# Patient Record
Sex: Female | Born: 1990 | Race: Black or African American | Hispanic: No | Smoking: Current every day smoker
Health system: Southern US, Community
[De-identification: ages and names within clinical notes are randomized; demographics above are authoritative.]

## PROBLEM LIST (undated history)

## (undated) ENCOUNTER — Inpatient Hospital Stay: Payer: Self-pay

## (undated) ENCOUNTER — Inpatient Hospital Stay: Admission: RE | Payer: Self-pay | Source: Home / Self Care

## (undated) DIAGNOSIS — N83209 Unspecified ovarian cyst, unspecified side: Secondary | ICD-10-CM

## (undated) DIAGNOSIS — R011 Cardiac murmur, unspecified: Secondary | ICD-10-CM

## (undated) HISTORY — PX: WISDOM TOOTH EXTRACTION: SHX21

---
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
## 2011-02-09 ENCOUNTER — Emergency Department (HOSPITAL_COMMUNITY)
Admission: EM | Admit: 2011-02-09 | Discharge: 2011-02-09 | Disposition: A | Discharge status: Home / Self Care | Payer: Self-pay | Attending: Emergency Medicine | Admitting: Emergency Medicine

## 2011-02-09 ENCOUNTER — Encounter (HOSPITAL_COMMUNITY): Payer: Self-pay

## 2011-02-09 DIAGNOSIS — K089 Disorder of teeth and supporting structures, unspecified: Secondary | ICD-10-CM | POA: Insufficient documentation

## 2011-02-09 DIAGNOSIS — K0889 Other specified disorders of teeth and supporting structures: Secondary | ICD-10-CM

## 2011-02-09 DIAGNOSIS — K029 Dental caries, unspecified: Secondary | ICD-10-CM | POA: Insufficient documentation

## 2011-02-09 LAB — POCT PREGNANCY, URINE: Preg Test, Ur: NEGATIVE

## 2011-02-09 MED ORDER — PENICILLIN V POTASSIUM 500 MG PO TABS: 500.0000 mg | ORAL_TABLET | Freq: Four times a day (QID) | ORAL | Status: AC

## 2011-02-09 MED ORDER — IBUPROFEN 800 MG PO TABS
800.0000 mg | ORAL_TABLET | Freq: Once | ORAL | Status: AC
  Administered 2011-02-09: 800 mg via ORAL
  Filled 2011-02-09: qty 1

## 2011-02-09 MED ORDER — HYDROCODONE-ACETAMINOPHEN 5-325 MG PO TABS
1.0000 | ORAL_TABLET | Freq: Once | ORAL | Status: AC
  Administered 2011-02-09: 1 via ORAL
  Filled 2011-02-09: qty 1

## 2011-02-09 MED ORDER — HYDROCODONE-ACETAMINOPHEN 5-325 MG PO TABS: ORAL_TABLET | ORAL | Status: DC

## 2011-02-09 MED ORDER — PENICILLIN V POTASSIUM 250 MG PO TABS
500.0000 mg | ORAL_TABLET | Freq: Once | ORAL | Status: AC
  Administered 2011-02-09: 500 mg via ORAL
  Filled 2011-02-09: qty 2

## 2011-02-09 NOTE — ED Provider Notes (Signed)
Medical screening examination/treatment/procedure(s) were performed by non-physician practitioner and as supervising physician I was immediately available for consultation/collaboration. Roston Grunewald, MD, FACEP   Rayni Nemitz L Rayford Williamsen, MD 02/09/11 1949 

## 2011-02-09 NOTE — ED Provider Notes (Signed)
History     CSN: [id number]  Arrival date & time 02/09/11  1020   None     Chief Complaint  Patient presents with  . Dental Pain    (Consider location/radiation/quality/duration/timing/severity/associated sxs/prior treatment) Patient is a 21 y.o. female presenting with tooth pain. The history is provided by the patient. No language interpreter was used.  Dental PainThe primary symptoms include mouth pain. Primary symptoms do not include dental injury or fever. Episode onset: last PM. The symptoms are unchanged. The symptoms occur constantly.  Additional symptoms include: jaw pain. Additional symptoms do not include: facial swelling.    History reviewed. No pertinent past medical history.  History reviewed. No pertinent past surgical history.  No family history on file.  History  Substance Use Topics  . Smoking status: Never Smoker   . Smokeless tobacco: Not on file  . Alcohol Use: No    OB History    Grav Para Term Preterm Abortions TAB SAB Ect Mult Living                  Review of Systems  Constitutional: Negative for fever.  HENT: Positive for dental problem. Negative for facial swelling.   All other systems reviewed and are negative.    Allergies  Review of patient's allergies indicates no known allergies.  Home Medications   Current Outpatient Rx  Name Route Sig Dispense Refill  . ACETAMINOPHEN 500 MG PO TABS Oral Take 1,000 mg by mouth every 6 (six) hours as needed. Tooth ache    . FLUOXETINE HCL 20 MG PO CAPS Oral Take 20 mg by mouth every morning.    Marland Kitchen LORAZEPAM 0.5 MG PO TABS Oral Take 0.5 mg by mouth every 8 (eight) hours as needed. anxiety    . TRAZODONE HCL 50 MG PO TABS Oral Take 50 mg by mouth at bedtime.      BP 150/91  Pulse 67  Temp(Src) 98.7 F (37.1 C) (Oral)  Resp 18  Ht 5\' 6"  (1.676 m)  Wt 225 lb (102.059 kg)  BMI 36.32 kg/m2  SpO2 100%  LMP 12/29/2010  Physical Exam  Nursing note and vitals reviewed. Constitutional: She is  oriented to person, place, and time. She appears well-developed and well-nourished. No distress.  HENT:  Head: Normocephalic and atraumatic.  Mouth/Throat: Uvula is midline, oropharynx is clear and moist and mucous membranes are normal. Dental caries present. No uvula swelling.    Eyes: EOM are normal.  Neck: Normal range of motion.  Cardiovascular: Normal rate, regular rhythm and normal heart sounds.   Pulmonary/Chest: Effort normal and breath sounds normal.  Abdominal: Soft. She exhibits no distension. There is no tenderness.  Musculoskeletal: Normal range of motion.  Neurological: She is alert and oriented to person, place, and time.  Skin: Skin is warm and dry.  Psychiatric: She has a normal mood and affect. Judgment normal.    ED Course  Procedures (including critical care time)   Labs Reviewed  POCT PREGNANCY, URINE   No results found.   No diagnosis found.    MDM          Worthy Rancher, PA 02/09/11 1228  Worthy Rancher, PA 02/09/11 [phone]

## 2011-02-09 NOTE — ED Notes (Signed)
Pt reports has had broken tooth for the past year but pain flared up today.  Also reports had positive pregnancy test in [place] or Nov but says has been having a period every month that lasts for 3 days.

## 2011-02-09 NOTE — ED Notes (Signed)
Reports she took a pregnancy test 3 months ago and it was positive. POC preg today is negative.

## 2011-04-26 ENCOUNTER — Emergency Department: Payer: Self-pay | Admitting: *Deleted

## 2011-04-26 LAB — URINALYSIS, COMPLETE
Bilirubin,UR: NEGATIVE
Glucose,UR: NEGATIVE mg/dL (ref 0–75)
RBC,UR: 1 /HPF (ref 0–5)
Specific Gravity: 1.023 (ref 1.003–1.030)
Squamous Epithelial: 3
WBC UR: 5 /HPF (ref 0–5)

## 2011-04-26 LAB — COMPREHENSIVE METABOLIC PANEL
Alkaline Phosphatase: 62 U/L (ref 50–136)
Anion Gap: 11 (ref 7–16)
BUN: 5 mg/dL — ABNORMAL LOW (ref 7–18)
Bilirubin,Total: 0.5 mg/dL (ref 0.2–1.0)
Osmolality: 272 (ref 275–301)
Total Protein: 7.5 g/dL (ref 6.4–8.2)

## 2011-04-26 LAB — CBC
HGB: 9.9 g/dL — ABNORMAL LOW (ref 12.0–16.0)
MCH: 19 pg — ABNORMAL LOW (ref 26.0–34.0)
MCHC: 30.4 g/dL — ABNORMAL LOW (ref 32.0–36.0)
MCV: 62 fL — ABNORMAL LOW (ref 80–100)

## 2011-04-26 LAB — HCG, QUANTITATIVE, PREGNANCY: Beta Hcg, Quant.: 88448 m[IU]/mL — ABNORMAL HIGH

## 2011-05-11 ENCOUNTER — Emergency Department: Payer: Self-pay

## 2011-05-11 LAB — CBC
MCH: 18.6 pg — ABNORMAL LOW (ref 26.0–34.0)
RBC: 5.09 10*6/uL (ref 3.80–5.20)
WBC: 7.1 10*3/uL (ref 3.6–11.0)

## 2011-05-11 LAB — COMPREHENSIVE METABOLIC PANEL
Albumin: 3.3 g/dL — ABNORMAL LOW (ref 3.4–5.0)
Creatinine: 0.58 mg/dL — ABNORMAL LOW (ref 0.60–1.30)
Glucose: 75 mg/dL (ref 65–99)
Potassium: 4.1 mmol/L (ref 3.5–5.1)
Total Protein: 7.1 g/dL (ref 6.4–8.2)

## 2011-05-11 LAB — URINALYSIS, COMPLETE
Ph: 6 (ref 4.5–8.0)
Protein: 30
RBC,UR: 6 /HPF (ref 0–5)

## 2011-05-11 LAB — HCG, QUANTITATIVE, PREGNANCY: Beta Hcg, Quant.: 133767 m[IU]/mL — ABNORMAL HIGH

## 2011-10-25 ENCOUNTER — Observation Stay: Payer: Self-pay | Admitting: Obstetrics and Gynecology

## 2011-12-04 ENCOUNTER — Observation Stay: Payer: Self-pay

## 2011-12-04 LAB — URINALYSIS, COMPLETE
Glucose,UR: NEGATIVE mg/dL (ref 0–75)
Leukocyte Esterase: NEGATIVE
Protein: NEGATIVE
RBC,UR: <1 /HPF (ref 0–5)
Specific Gravity: 1.009 (ref 1.003–1.030)
WBC UR: 2 /HPF (ref 0–5)

## 2011-12-05 ENCOUNTER — Observation Stay: Payer: Self-pay

## 2011-12-07 ENCOUNTER — Inpatient Hospital Stay: Payer: Self-pay | Admitting: Obstetrics and Gynecology

## 2011-12-07 ENCOUNTER — Observation Stay: Payer: Self-pay | Admitting: Obstetrics and Gynecology

## 2011-12-08 LAB — CBC WITH DIFFERENTIAL/PLATELET
Basophil #: 0.1 10*3/uL (ref 0.0–0.1)
Lymphocyte %: 21.8 %
Monocyte %: 8.2 %
Platelet: 163 10*3/uL (ref 150–440)
RDW: 17.3 % — ABNORMAL HIGH (ref 11.5–14.5)

## 2011-12-09 LAB — BETA STREP CULTURE(ARMC)

## 2011-12-09 LAB — HEMATOCRIT: HCT: 25.9 % — ABNORMAL LOW (ref 35.0–47.0)

## 2012-07-05 ENCOUNTER — Emergency Department: Payer: Self-pay | Admitting: Unknown Physician Specialty

## 2014-05-20 NOTE — H&P (Signed)
L&D Evaluation:  History:   HPI 24 yo G3P1011 [facility] uls presents with constant lower abdominal pressure/pain x 3 days. She also c/o mid upper abdominal stinging pain for the same amt of time sometimes accompanied by a tightening feeling. Patient currently living at [facility] and was brought in by someone who works there. C/O some LOF or increased vaginal discharge, but denies vaginal bleeding. Baby active. She has had increased frequency but no dysuria. No diarrhea, N/V. Denies trauma or fall.  [facility], MJ use and a known 5cm R ovarian cyst seen on 23wk scan. She has lost weight with the pregnancy. Hx of SVD at term in 2007.    Presents with abdominal pain, leaking fluid    Patient's Medical History Postpartum depression, Anemia    Patient's Surgical History none    Medications Pre Natal Vitamins    Allergies NKDA    Social History tobacco  drugs  MJ    Family History Anemia, HTN, HTN, CAD   ROS:   ROS see HPI   Exam:   Vital Signs stable  115/71. Patient in NAD, appears comfortable    Urine Protein negative dipstick, UA unremarkable    General no apparent distress    Mental Status clear    Abdomen gravid, non-tender, vtx on [place] and US, upper abdomen soft, ND, with normal BS, no guarding    Estimated Fetal Weight Average for gestational age    Fetal Position vtx    Pelvic no external lesions, 1/75%/-1. Wet prep negative. fern negative. Nitrazine neg    Mebranes Intact    FHT 125 baseline with accels to 150, mod variability. Some early decels with some contractions    FHT Description Reactive NST    Ucx irregular, occasional    Skin dry    Other AFI=3.04cm+, 2.45cm+2.61cm=8.14cm   Impression:   Impression IUP at 39 2/7 weeks not in labor and no ROM. Lower abdominal pressure probably due to baby moving into pelvis.   Plan:   Plan DC home with labor precautions. RTN to L&D on 11/29 for  NST/AFI if NIL.   Electronic Signatures: Trinna BalloonGutierrez, Rethel Sebek L (CNM)  [phone](Signed 26-Nov-13 00:22)  Authored: L&D Evaluation   Last Updated: 26-Nov-13 00:22 by Trinna BalloonGutierrez, Zarah Carbon L (CNM)

## 2014-05-20 NOTE — H&P (Signed)
L&D Evaluation:  History:   HPI 24 yo G3P1011 @ 33wks EDC 12/09/11 by 7wk uls presents with RLQ pain and vaginal spotting x 2 today.  Denies recent intercourse.  Her RLQ pain has been present for a week but worsened today.  She has had no other bleeding.  [facility], MJ use and a known 5cm R ovarian cyst seen on 23wk scan.    Presents with abdominal pain, vaginal bleeding    Patient's Medical History Postpartum depression    Patient's Surgical History none    Medications Pre Natal Vitamins    Allergies NKDA    Social History tobacco  drugs  MJ    Family History Anemia, HTN, HTN, CAD   ROS:   ROS All systems were reviewed.  HEENT, CNS, GI, GU, Respiratory, CV, Renal and Musculoskeletal systems were found to be normal., except per HPI   Exam:   Vital Signs stable    Urine Protein not completed    General no apparent distress    Mental Status clear    Chest clear    Heart normal sinus rhythm    Abdomen gravid, non-tender    Estimated Fetal Weight Average for gestational age    Fetal Position Vertex (confirmed by limited bedside scan to assess placentation since only 7wk scan available for review), placenta is posterior fundal    Edema no edema    Pelvic cervix closed and thick, no bleeding noted    Mebranes Intact    FHT normal rate with no decels, +Accels, reactive NST    Fetal Heart Rate 130    Ucx absent    Skin dry    Lymph no lymphadenopathy   Impression:   Impression RLQ pain, spotting   Plan:   Comments 1)  Reassurance re: occasional spotting in 3rd trimester.  No placenta previa noted.  Call or return to ER with increased bleeding or pain. 2)  RLQ pain likely from known 5cm cyst on RO.  Again, pt to return with worsening pain.  Cyst this size in 3rd trimester pregnancy less likely to torse.  Pt told to monitor symptoms.   3)  Keep scheduled f/u with [facility].   Electronic Signatures: Senaida LangeWeaver-Lee, Trinity Hyland (MD)  (Signed 15-Oct-13  19:04)  Authored: L&D Evaluation   Last Updated: 15-Oct-13 19:04 by Senaida LangeWeaver-Lee, Kenniel Bergsma (MD)
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
## 2014-05-20 NOTE — H&P (Signed)
L&D Evaluation:  History Expanded:   HPI 24 [facility] BF [facility]  presents with the question of ruptured membranes, she says she is having ctx every 5 minutes and was 1 cm in the office yesterday. she says she is leaking and has been since she was checked. good fetal mvmt GBS neg done on 11/04/11. pt has been pos for MJ, and chlamydia, she has anemia, and had bad PP depression with an [place] if 16, no meds at the time, got tDap on 9/25    Gravida 3    Term 1    PreTerm 0    Abortion 1    Living 1    Blood Type (Maternal) A positive    Group B Strep Results Maternal (Result >5wks must be treated as unknown) negative    Maternal HIV Negative    Maternal Syphilis Ab Nonreactive    Maternal Varicella Immune    Rubella Results (Maternal) immune    Maternal T-Dap Immune    [facility]    Presents with contractions, leaking fluid    Patient's Medical History No Chronic Illness    Patient's Surgical History none    Medications Pre Natal Vitamins    Allergies NKDA    Social History none    Family History Non-Contributory   ROS:   ROS All systems were reviewed.  HEENT, CNS, GI, GU, Respiratory, CV, Renal and Musculoskeletal systems were found to be normal.   Exam:   Vital Signs stable    Urine Protein not completed    General no apparent distress    Mental Status clear    Chest clear    Heart normal sinus rhythm    Abdomen gravid, tender with contractions    Estimated Fetal Weight Average for gestational age    Fetal Position v    Fundal Height term    Back no CVAT    Pelvic no external lesions, 2 cm    Mebranes Intact, although pt states leaking    FHT normal rate with no decels    FHT Description normal variability  no decels    Fetal Heart Rate 145    Ucx irregular    Skin dry    Lymph no lymphadenopathy   Impression:   Impression evidence of rupture will do testing to be sure   Plan:   Plan ph, fern and pooling  check    Comments GBS was done on 10/25.Marland Kitchen.Marland Kitchen.so will expire 5 wks, if not kept then we will submit a new test so it is on file.    Follow Up Appointment need to schedule   Electronic Signatures: Adria DevonKlett, Therman Hughlett (MD)  (Signed [phone]-Nov-13 10:30)  Authored: L&D Evaluation   Last Updated: 27-Nov-13 10:30 by Adria DevonKlett, Kahleb Mcclane (MD)

## 2014-12-15 ENCOUNTER — Emergency Department
Admission: EM | Admit: 2014-12-15 | Discharge: 2014-12-15 | Disposition: A | Discharge status: Home / Self Care | Payer: No Typology Code available for payment source | Attending: Emergency Medicine | Admitting: Emergency Medicine

## 2014-12-15 ENCOUNTER — Emergency Department: Payer: No Typology Code available for payment source

## 2014-12-15 ENCOUNTER — Encounter: Payer: Self-pay | Admitting: *Deleted

## 2014-12-15 DIAGNOSIS — Y92410 Unspecified street and highway as the place of occurrence of the external cause: Secondary | ICD-10-CM | POA: Insufficient documentation

## 2014-12-15 DIAGNOSIS — S29012A Strain of muscle and tendon of back wall of thorax, initial encounter: Secondary | ICD-10-CM | POA: Insufficient documentation

## 2014-12-15 DIAGNOSIS — Y9389 Activity, other specified: Secondary | ICD-10-CM | POA: Diagnosis not present

## 2014-12-15 DIAGNOSIS — S29019A Strain of muscle and tendon of unspecified wall of thorax, initial encounter: Secondary | ICD-10-CM

## 2014-12-15 DIAGNOSIS — S46911A Strain of unspecified muscle, fascia and tendon at shoulder and upper arm level, right arm, initial encounter: Secondary | ICD-10-CM | POA: Diagnosis not present

## 2014-12-15 DIAGNOSIS — S161XXA Strain of muscle, fascia and tendon at neck level, initial encounter: Secondary | ICD-10-CM | POA: Insufficient documentation

## 2014-12-15 DIAGNOSIS — F17200 Nicotine dependence, unspecified, uncomplicated: Secondary | ICD-10-CM | POA: Diagnosis not present

## 2014-12-15 DIAGNOSIS — Z79899 Other long term (current) drug therapy: Secondary | ICD-10-CM | POA: Insufficient documentation

## 2014-12-15 DIAGNOSIS — Y998 Other external cause status: Secondary | ICD-10-CM | POA: Insufficient documentation

## 2014-12-15 DIAGNOSIS — S199XXA Unspecified injury of neck, initial encounter: Secondary | ICD-10-CM | POA: Diagnosis present

## 2014-12-15 MED ORDER — IBUPROFEN 800 MG PO TABS: 800.0000 mg | ORAL_TABLET | Freq: Three times a day (TID) | ORAL | Status: DC

## 2014-12-15 NOTE — Discharge Instructions (Signed)
Cervical Sprain A cervical sprain is when the tissues (ligaments) that hold the neck bones in place stretch or tear. HOME CARE   Put ice on the injured area.  Put ice in a plastic bag.  Place a towel between your skin and the bag.  Leave the ice on for 15-20 minutes, 3-4 times a day.  You may have been given a collar to wear. This collar keeps your neck from moving while you heal.  Do not take the collar off unless told by your doctor.  If you have long hair, keep it outside of the collar.  Ask your doctor before changing the position of your collar. You may need to change its position over time to make it more comfortable.  If you are allowed to take off the collar for cleaning or bathing, follow your doctor's instructions on how to do it safely.  Keep your collar clean by wiping it with mild soap and water. Dry it completely. If the collar has removable pads, remove them every 1-2 days to hand wash them with soap and water. Allow them to air dry. They should be dry before you wear them in the collar.  Do not drive while wearing the collar.  Only take medicine as told by your doctor.  Keep all doctor visits as told.  Keep all physical therapy visits as told.  Adjust your work station so that you have good posture while you work.  Avoid positions and activities that make your problems worse.  Warm up and stretch before being active. GET HELP IF:  Your pain is not controlled with medicine.  You cannot take less pain medicine over time as planned.  Your activity level does not improve as expected. GET HELP RIGHT AWAY IF:   You are bleeding.  Your stomach is upset.  You have an allergic reaction to your medicine.  You develop new problems that you cannot explain.  You lose feeling (become numb) or you cannot move any part of your body (paralysis).  You have tingling or weakness in any part of your body.  Your symptoms get worse. Symptoms include:  Pain,  soreness, stiffness, puffiness (swelling), or a burning feeling in your neck.  Pain when your neck is touched.  Shoulder or upper back pain.  Limited ability to move your neck.  Headache.  Dizziness.  Your hands or arms feel week, lose feeling, or tingle.  Muscle spasms.  Difficulty swallowing or chewing. MAKE SURE YOU:   Understand these instructions.  Will watch your condition.  Will get help right away if you are not doing well or get worse.   This information is not intended to replace advice given to you by your health care provider. Make sure you discuss any questions you have with your health care provider.   Document Released: 06/15/2007 Document Revised: 08/29/2012 Document Reviewed: 07/04/2012 Elsevier Interactive Patient Education 2016 [employer]   Moist heat or ice to the muscles as needed for pain and discomfort. Ibuprofen for pain and inflammation. Follow-up with your doctor if any continued problems.
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
## 2014-12-15 NOTE — ED Notes (Signed)
Assessment per pa 

## 2015-10-11 ENCOUNTER — Emergency Department (HOSPITAL_COMMUNITY)
Admission: EM | Admit: 2015-10-11 | Discharge: 2015-10-11 | Disposition: A | Discharge status: Home / Self Care | Payer: Medicaid Other | Attending: Emergency Medicine | Admitting: Emergency Medicine

## 2015-10-11 ENCOUNTER — Encounter (HOSPITAL_COMMUNITY): Payer: Self-pay

## 2015-10-11 DIAGNOSIS — Z79899 Other long term (current) drug therapy: Secondary | ICD-10-CM | POA: Diagnosis not present

## 2015-10-11 DIAGNOSIS — K047 Periapical abscess without sinus: Secondary | ICD-10-CM | POA: Insufficient documentation

## 2015-10-11 DIAGNOSIS — F17200 Nicotine dependence, unspecified, uncomplicated: Secondary | ICD-10-CM | POA: Insufficient documentation

## 2015-10-11 DIAGNOSIS — K0889 Other specified disorders of teeth and supporting structures: Secondary | ICD-10-CM | POA: Diagnosis present

## 2015-10-11 MED ORDER — AMOXICILLIN 500 MG PO CAPS
500.0000 mg | ORAL_CAPSULE | Freq: Once | ORAL | Status: AC
  Administered 2015-10-11: 500 mg via ORAL
  Filled 2015-10-11: qty 1

## 2015-10-11 MED ORDER — AMOXICILLIN 500 MG PO CAPS: 500.0000 mg | ORAL_CAPSULE | Freq: Three times a day (TID) | ORAL | 0 refills | Status: DC

## 2015-10-11 MED ORDER — BUPIVACAINE-EPINEPHRINE (PF) 0.5% -1:200000 IJ SOLN
1.8000 mL | Freq: Once | INTRAMUSCULAR | Status: AC
Start: 2015-10-11 — End: 2015-10-11
  Administered 2015-10-11: 1.8 mL
  Filled 2015-10-11: qty 1.8

## 2015-10-11 MED ORDER — BUPIVACAINE-EPINEPHRINE (PF) 0.5% -1:200000 IJ SOLN
1.8000 mL | Freq: Once | INTRAMUSCULAR | Status: AC
  Administered 2015-10-11: 1.8 mL
  Filled 2015-10-11: qty 1.8

## 2015-10-11 MED ORDER — OXYCODONE-ACETAMINOPHEN 5-325 MG PO TABS: 1.0000 | ORAL_TABLET | ORAL | 0 refills | Status: DC | PRN

## 2015-10-11 MED ORDER — OXYCODONE-ACETAMINOPHEN 5-325 MG PO TABS
1.0000 | ORAL_TABLET | Freq: Once | ORAL | Status: AC
  Administered 2015-10-11: 1 via ORAL
  Filled 2015-10-11: qty 1

## 2015-10-11 NOTE — Discharge Instructions (Signed)
Eat a soft diet for the next 24-48 hours.  Rinse your mouth with saltwater or regular water after you eat any food.  Do not swallow blood or pus becuase it will cause you to vomit,  spit out instead.  Take percocet for breakthrough pain, do not drink alcohol, drive, care for children or do other critical tasks while taking percocet.  Return to the emergency room for fever, change in vision, redness to the face that rapidly spreads towards the eye, nausea or vomiting, difficulty swallowing or shortness of breath.   Apply warm compresses to jaw throughout the day.   Take your antibiotics as directed and to the end of the course. DO NOT drink alcohol when taking metronidazole, it will make you very sick!   Followup with a dentist is very important for ongoing evaluation and management of recurrent dental pain. Return to emergency department for emergent changing or worsening symptoms.

## 2015-10-11 NOTE — ED Provider Notes (Signed)
[facility]-EMERGENCY DEPT Provider Note   CSN: [id number] Arrival date & time: 10/11/15  0213     History   Chief Complaint Chief Complaint  Patient presents with  . Dental Pain    HPI  Blood pressure 124/79, pulse 82, temperature 98.4 F (36.9 C), temperature source Oral, resp. rate 18, last menstrual period 11/17/2014, SpO2 100 %.  Brianna Jarvis is a 25 y.o. female complaining of Dental pain and dental swelling worsening over the course of the last 48 hours. She's taken multiple over-the-counter pain medications with little relief, she denies fever, chills, difficulty swallowing or difficulty breathing. States that the swelling is an area of a fractured tooth which she's had for an extended period of time.States that in the past she is required multiple injections to achieve dental anesthesia.    HPI  History reviewed. No pertinent past medical history.  There are no active problems to display for this patient.   History reviewed. No pertinent surgical history.  OB History    Gravida Para Term Preterm AB Living   1             SAB TAB Ectopic Multiple Live Births                   Home Medications    Prior to Admission medications   Medication Sig Start Date End Date Taking? Authorizing Provider  acetaminophen (TYLENOL) 500 MG tablet Take 1,000 mg by mouth every 6 (six) hours as needed for moderate pain. Tooth ache    Yes Historical Provider, MD  amoxicillin (AMOXIL) 500 MG capsule Take 1 capsule (500 mg total) by mouth 3 (three) times daily. 10/11/15   Tasman Zapata, PA-C  HYDROcodone-acetaminophen (NORCO) 5-325 MG per tablet One tab po q 4-6 hrs prn pain Patient not taking: Reported on 10/11/2015 02/09/11   Worthy Rancher, PA-C  ibuprofen (ADVIL,MOTRIN) 800 MG tablet Take 1 tablet (800 mg total) by mouth 3 (three) times daily. Patient not taking: Reported on 10/11/2015 12/15/14   Tommi Rumps, PA-C  oxyCODONE-acetaminophen (PERCOCET) 5-325 MG tablet Take 1  tablet by mouth every 4 (four) hours as needed. 10/11/15   Joni Reining Shakiyla Kook, PA-C    Family History No family history on file.  Social History Social History  Substance Use Topics  . Smoking status: Current Every Day Smoker  . Smokeless tobacco: Current User  . Alcohol use No     Allergies   Review of patient's allergies indicates no known allergies.   Review of Systems Review of Systems  10 systems reviewed and found to be negative, except as noted in the HPI.   Physical Exam Updated Vital Signs BP 132/79 (BP Location: Left Arm)   Pulse 78   Temp 98.5 F (36.9 C) (Oral)   Resp 20   LMP 11/17/2014   SpO2 100%   Physical Exam  Constitutional: She is oriented to person, place, and time. She appears well-developed and well-nourished. No distress.  HENT:  Head: Normocephalic and atraumatic.  Mouth/Throat: Oropharynx is clear and moist.    Generally poor dentition with deep dental caries. Patient has fluctuant abscess as diagrammed, swelling visible from on the face but with with no facial cellulitis. Patient is handling their secretions. There is no tenderness to palpation or firmness underneath tongue bilaterally. No trismus.      Eyes: Conjunctivae and EOM are normal. Pupils are equal, round, and reactive to light.  Neck: Normal range of motion.  Cardiovascular: Normal rate, regular rhythm  and intact distal pulses.   Pulmonary/Chest: Effort normal and breath sounds normal.  Abdominal: Soft. There is no tenderness.  Musculoskeletal: Normal range of motion.  Neurological: She is alert and oriented to person, place, and time.  Skin: She is not diaphoretic.  Psychiatric: She has a normal mood and affect.  Nursing note and vitals reviewed.    ED Treatments / Results  Labs (all labs ordered are listed, but only abnormal results are displayed) Labs Reviewed - No data to display  EKG  EKG Interpretation None       Radiology No results  found.  Procedures .Marland Kitchen.Incision and Drainage Date/Time: 10/11/2015 4:16 AM Performed by: Wynetta EmeryPISCIOTTA, Ellinore Merced Authorized by: Wynetta EmeryPISCIOTTA, Kennette Cuthrell   Consent:    Consent obtained:  Verbal Location:    Type:  Abscess   Location:  Mouth   Mouth location:  Alveolar process Anesthesia (see MAR for exact dosages):    Anesthesia method:  Nerve block   Block location:  Right inferior alveolar   Block needle gauge:  27 G   Block anesthetic:  Bupivacaine 0.5% WITH epi   Block injection procedure:  Anatomic landmarks identified   Block outcome:  Anesthesia achieved Procedure type:    Complexity:  Complex Procedure details:    Needle aspiration: no     Incision types:  Elliptical   Incision depth:  Dermal   Scalpel blade:  11   Wound management:  Probed and deloculated   Drainage:  Bloody and purulent   Drainage amount:  Moderate   Packing materials:  None Post-procedure details:    Patient tolerance of procedure:  Tolerated well, no immediate complications     (including critical care time)  Medications Ordered in ED Medications  bupivacaine-epinephrine (MARCAINE W/ EPI) 0.5% -1:200000 injection 1.8 mL (1.8 mLs Infiltration Given 10/11/15 0259)  bupivacaine-epinephrine (MARCAINE W/ EPI) 0.5% -1:200000 injection 1.8 mL (1.8 mLs Infiltration Given 10/11/15 0259)  bupivacaine-epinephrine (MARCAINE W/ EPI) 0.5% -1:200000 injection 1.8 mL (1.8 mLs Infiltration Given 10/11/15 0259)  amoxicillin (AMOXIL) capsule 500 mg (500 mg Oral Given 10/11/15 0445)  oxyCODONE-acetaminophen (PERCOCET/ROXICET) 5-325 MG per tablet 1 tablet (1 tablet Oral Given 10/11/15 0445)     Initial Impression / Assessment and Plan / ED Course  I have reviewed the triage vital signs and the nursing notes.  Pertinent labs & imaging results that were available during my care of the patient were reviewed by me and considered in my medical decision making (see chart for details).  Clinical Course   Vitals:   10/11/15 0223  10/11/15 0444  BP: 124/79 132/79  Pulse: 82 78  Resp: 18 20  Temp: 98.4 F (36.9 C) 98.5 F (36.9 C)  TempSrc: Oral Oral  SpO2: 100% 100%    Medications  bupivacaine-epinephrine (MARCAINE W/ EPI) 0.5% -1:200000 injection 1.8 mL (1.8 mLs Infiltration Given 10/11/15 0259)  bupivacaine-epinephrine (MARCAINE W/ EPI) 0.5% -1:200000 injection 1.8 mL (1.8 mLs Infiltration Given 10/11/15 0259)  bupivacaine-epinephrine (MARCAINE W/ EPI) 0.5% -1:200000 injection 1.8 mL (1.8 mLs Infiltration Given 10/11/15 0259)  amoxicillin (AMOXIL) capsule 500 mg (500 mg Oral Given 10/11/15 0445)  oxyCODONE-acetaminophen (PERCOCET/ROXICET) 5-325 MG per tablet 1 tablet (1 tablet Oral Given 10/11/15 0445)    Alba CoryDeborah Gains is 25 y.o. female presenting with Large right lower dental abscess. I and D is performed. Patient will be started on amoxicillin, given dental referral and pain medication.   Evaluation does not show pathology that would require ongoing emergent intervention or inpatient treatment. Pt is hemodynamically  stable and mentating appropriately. Discussed findings and plan with patient/guardian, who agrees with care plan. All questions answered. Return precautions discussed and outpatient follow up given.      Final Clinical Impressions(s) / ED Diagnoses   Final diagnoses:  Dental abscess    New Prescriptions Discharge Medication List as of 10/11/2015  4:15 AM    START taking these medications   Details  amoxicillin (AMOXIL) 500 MG capsule Take 1 capsule (500 mg total) by mouth 3 (three) times daily., Starting Sun 10/11/2015, Print    oxyCODONE-acetaminophen (PERCOCET) 5-325 MG tablet Take 1 tablet by mouth every 4 (four) hours as needed., Starting Sun 10/11/2015, Print         Rodney Wigger, PA-C 10/11/15 5284    Gilda Crease, MD 10/11/15 [phone]

## 2015-10-11 NOTE — ED Triage Notes (Signed)
Patient c/o facial swelling and tooth pain.  Patient states that tooth on the right bottom of mouth is half missing.  Patient states that mouth began to swell yesterday and woke up this morning and swelling is much worse.  Patient denies difficulty breathing or swallowing.  Patient is speaking full sentences in triage.  Patient rates pain 8/10

## 2015-10-30 ENCOUNTER — Encounter (HOSPITAL_COMMUNITY): Payer: Self-pay | Admitting: Emergency Medicine

## 2015-10-30 ENCOUNTER — Emergency Department (HOSPITAL_COMMUNITY)
Admission: EM | Admit: 2015-10-30 | Discharge: 2015-10-30 | Disposition: A | Discharge status: Home / Self Care | Payer: Medicaid Other | Attending: Emergency Medicine | Admitting: Emergency Medicine

## 2015-10-30 ENCOUNTER — Emergency Department (HOSPITAL_COMMUNITY): Payer: Medicaid Other

## 2015-10-30 DIAGNOSIS — O26892 Other specified pregnancy related conditions, second trimester: Secondary | ICD-10-CM | POA: Insufficient documentation

## 2015-10-30 DIAGNOSIS — R1030 Lower abdominal pain, unspecified: Secondary | ICD-10-CM | POA: Insufficient documentation

## 2015-10-30 DIAGNOSIS — F17210 Nicotine dependence, cigarettes, uncomplicated: Secondary | ICD-10-CM | POA: Insufficient documentation

## 2015-10-30 DIAGNOSIS — Z3A22 22 weeks gestation of pregnancy: Secondary | ICD-10-CM | POA: Diagnosis not present

## 2015-10-30 DIAGNOSIS — R102 Pelvic and perineal pain: Secondary | ICD-10-CM

## 2015-10-30 DIAGNOSIS — R109 Unspecified abdominal pain: Secondary | ICD-10-CM

## 2015-10-30 LAB — URINALYSIS, ROUTINE W REFLEX MICROSCOPIC
BILIRUBIN URINE: NEGATIVE
Glucose, UA: NEGATIVE mg/dL
Hgb urine dipstick: NEGATIVE
KETONES UR: NEGATIVE mg/dL
Leukocytes, UA: NEGATIVE
NITRITE: NEGATIVE
PROTEIN: NEGATIVE mg/dL
SPECIFIC GRAVITY, URINE: 1.026 (ref 1.005–1.030)
pH: 7.5 (ref 5.0–8.0)

## 2015-10-30 LAB — WET PREP, GENITAL
Clue Cells Wet Prep HPF POC: NONE SEEN
SPERM: NONE SEEN
TRICH WET PREP: NONE SEEN
YEAST WET PREP: NONE SEEN

## 2015-10-30 MED ORDER — ACETAMINOPHEN 500 MG PO TABS: 1000.0000 mg | ORAL_TABLET | Freq: Four times a day (QID) | ORAL | 0 refills | Status: DC | PRN

## 2015-10-30 NOTE — ED Provider Notes (Signed)
[facility]-EMERGENCY DEPT Provider Note   CSN: [id number] Arrival date & time: 10/30/15  1100     History   Chief Complaint Chief Complaint  Patient presents with  . Abdominal Pain    Pregnant    HPI Brianna Jarvis is a 25 y.o. female.  HPI Patient ports EDC of 01/23/2016. She reports having had routine prenatal care through health Department. No prior ultrasound. Yesterday evening she developed lower abdominal pain. She reports is both a tight and crampy quality as well as occasional sharp stabbing pains. She was having more stabbing pains yesterday evening. Today is mostly a tight quality of the lower abdomen. She also reports some lower back pain. No vaginal bleeding or discharge. No vaginal fluid leak. Pain burning or urgency. She reports slight nausea but otherwise feels well. 2 prior on uncomplicated pregnancies. History reviewed. No pertinent past medical history.  There are no active problems to display for this patient.   History reviewed. No pertinent surgical history.  OB History    Gravida Para Term Preterm AB Living   3 2 2     2    SAB TAB Ectopic Multiple Live Births                   Home Medications    Prior to Admission medications   Medication Sig Start Date End Date Taking? Authorizing Provider  Prenatal Vit-Fe Fumarate-FA (PRENATAL MULTIVITAMIN) TABS tablet Take 1 tablet by mouth daily at 12 noon.   Yes Historical Provider, MD  acetaminophen (TYLENOL) 500 MG tablet Take 2 tablets (1,000 mg total) by mouth every 6 (six) hours as needed. 10/30/15   Arby BarretteMarcy Saige Busby, MD  amoxicillin (AMOXIL) 500 MG capsule Take 1 capsule (500 mg total) by mouth 3 (three) times daily. Patient not taking: Reported on 10/30/2015 10/11/15   Joni ReiningNicole Pisciotta, PA-C  oxyCODONE-acetaminophen (PERCOCET) 5-325 MG tablet Take 1 tablet by mouth every 4 (four) hours as needed. Patient not taking: Reported on 10/30/2015 10/11/15   Wynetta EmeryNicole Pisciotta, PA-C    Family History No family history  on file.  Social History Social History  Substance Use Topics  . Smoking status: Current Every Day Smoker    Packs/day: 0.25    Types: Cigarettes  . Smokeless tobacco: Current User  . Alcohol use No     Allergies   Review of patient's allergies indicates no known allergies.   Review of Systems Review of Systems 10 Systems reviewed and are negative for acute change except as noted in the HPI.  Physical Exam Updated Vital Signs BP 122/71   Pulse 74   Temp 98.4 F (36.9 C)   Resp 14   LMP 04/11/2015   SpO2 100%   Physical Exam  Constitutional: She appears well-developed and well-nourished. No distress.  HENT:  Head: Normocephalic and atraumatic.  Eyes: Conjunctivae and EOM are normal.  Neck: Neck supple.  Cardiovascular: Normal rate and regular rhythm.   No murmur heard. Pulmonary/Chest: Effort normal and breath sounds normal. No respiratory distress.  Abdominal: Soft. There is tenderness.  Gravid abdomen. Moderate suprapubic lower abdominal discomfort palpation. No guarding.  Genitourinary:  Genitourinary Comments: Normal external female genitalia. Speculum moderate amount of whitish discharge in the vault. Cervix is nonfriable. Mild cervical tenderness and lower uterine tenderness.  Musculoskeletal: She exhibits no edema or tenderness.  Neurological: She is alert.  Skin: Skin is warm and dry.  Psychiatric: She has a normal mood and affect.  Nursing note and vitals reviewed.    ED  Treatments / Results  Labs (all labs ordered are listed, but only abnormal results are displayed) Labs Reviewed  WET PREP, GENITAL - Abnormal; Notable for the following:       Result Value   WBC, Wet Prep HPF POC MANY (*)    All other components within normal limits  URINALYSIS, ROUTINE W REFLEX MICROSCOPIC (NOT AT [facility]) - Abnormal; Notable for the following:    APPearance CLOUDY (*)    All other components within normal limits  GC/CHLAMYDIA PROBE AMP ([place]) NOT AT [facility]     EKG  EKG Interpretation None       Radiology US Ob Limited  Result Date: 10/30/2015 CLINICAL DATA:  Pelvic pain since last night. No prenatal care. LMP 04/15/2015. EXAM: LIMITED OBSTETRIC ULTRASOUND FINDINGS: Number of Fetuses:  1 Heart Rate:  145 bpm Movement:  Present Presentation: Cephalic Placental Location: Anterior Previa: No Amniotic Fluid (Subjective):  Within normal limits. BPD:  5.53cm 22w  6d MATERNAL FINDINGS: Cervix:  Appears closed. Uterus/Adnexae:  No abnormality visualized. IMPRESSION: Single live intrauterine pregnancy corresponding with gestational age of [redacted] weeks 6 days based on BPD only. No abnormalities identified. This exam is performed on an emergent basis and does not comprehensively evaluate fetal size, dating, or anatomy; follow-up complete OB US should be considered if further fetal assessment is warranted. Electronically Signed   By: Carey Bullocks M.D.   On: 10/30/2015 14:09    Procedures Procedures (including critical care time)  Medications Ordered in ED Medications - No data to display   Initial Impression / Assessment and Plan / ED Course  I have reviewed the triage vital signs and the nursing notes.  Pertinent labs & imaging results that were available during my care of the patient were reviewed by me and considered in my medical decision making (see chart for details).  Clinical Course   Fetal monitoring by OB was with normal heart rate and no contractions.  Final Clinical Impressions(s) / ED Diagnoses   Final diagnoses:  Abdominal pain during pregnancy in second trimester   Urinalysis is negative. Patient's abdominal pain is reproducible in her suprapubic region. On pelvic examination she did not endorse tenderness more laterally or higher to suggest appendicitis or other GI etiology. Patient is otherwise well. She has does not have fever or vomiting. At this time I feel she is safe for continued outpatient observation with conservative  treatment with Tylenol at home and follow-up. New Prescriptions New Prescriptions   ACETAMINOPHEN (TYLENOL) 500 MG TABLET    Take 2 tablets (1,000 mg total) by mouth every 6 (six) hours as needed.     Arby Barrette, MD 10/30/15 [phone]

## 2015-10-30 NOTE — ED Notes (Signed)
Pelvic cart at bedside. 

## 2015-10-30 NOTE — Progress Notes (Signed)
[facility] 27.6 [facility], complaining of intermittent lower abdomen pain and shooting pain. Pt states baby moving well, denies and bleeding or leaking. Gets PNC at health dept. Placed on fetal monitor for evaluation.

## 2015-10-30 NOTE — Progress Notes (Signed)
Called Dr Jerrol BananaAnwanyu informed of pt status, and ED POC, to get reassuring fetal tracing, and then pt will be OB cleared.

## 2015-10-30 NOTE — ED Triage Notes (Signed)
Pt reports intermittent lower abdominal stabbing absent of vaginal discharge or bleeding. Pt reports adequate hydration with some nausea. Pt is 6 months pregnant with routine prenatal care. 01/23/16 EDD.

## 2015-10-30 NOTE — ED Notes (Signed)
Pt ambulatory and independent at discharge.  Verbalized understanding of discharge instructions 

## 2015-10-30 NOTE — ED Notes (Signed)
Sharp stabbing pain occurred at this time. Will continue to monitor for regularity and interval.

## 2015-10-30 NOTE — ED Notes (Signed)
US at bedside. Will attempt to obtain urine after ultrasound.

## 2015-10-30 NOTE — ED Notes (Signed)
Pt reports getting prenatal care through health department. No US yet. Blood tests have been normal. FHR 152.

## 2015-11-02 LAB — GC/CHLAMYDIA PROBE AMP (~~LOC~~) NOT AT ARMC
Chlamydia: NEGATIVE
NEISSERIA GONORRHEA: NEGATIVE

## 2016-09-03 ENCOUNTER — Encounter (HOSPITAL_COMMUNITY): Payer: Self-pay
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
## 2017-06-18 IMAGING — US US OB LIMITED
1 series · 14 of 28 positions shown · non-contrast
Comparison: none

CLINICAL DATA: Pelvic pain since last night. No prenatal care. LMP
04/15/2015.

EXAM:
LIMITED OBSTETRIC ULTRASOUND

[Series 1: us ob limited · 0.19mm/px · 30 acquisitions, 14 frames shown]
[im 2/30]
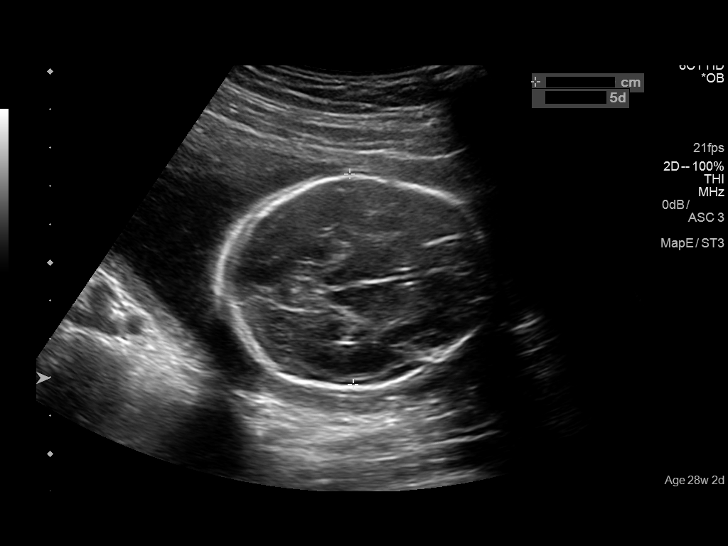
[im 4/30]
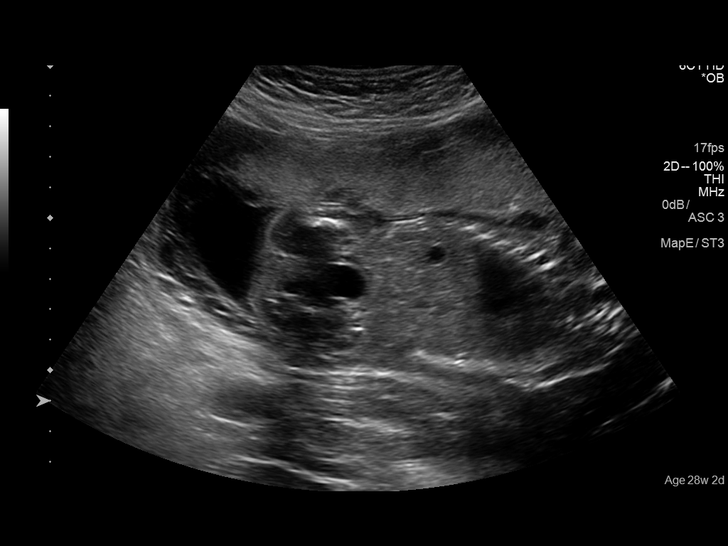
[im 6/30]
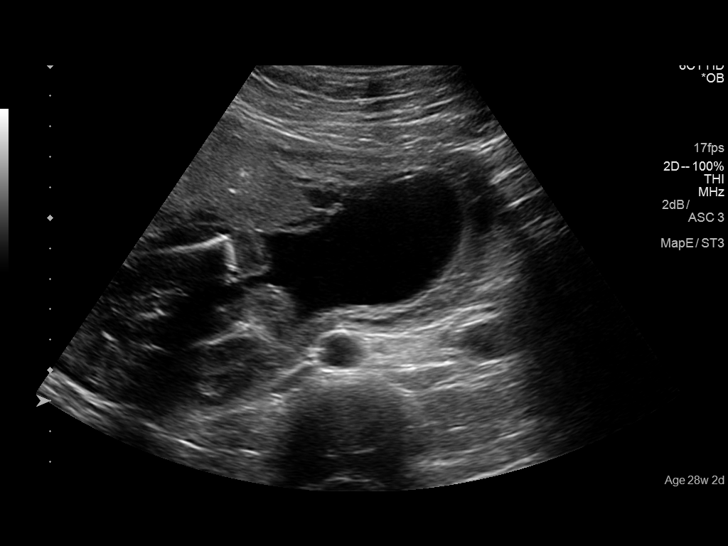
[im 8/30]
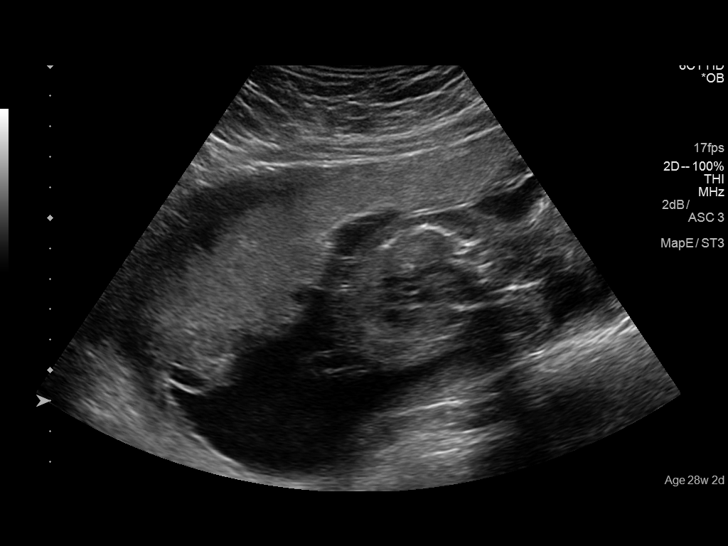
[im 10/30]
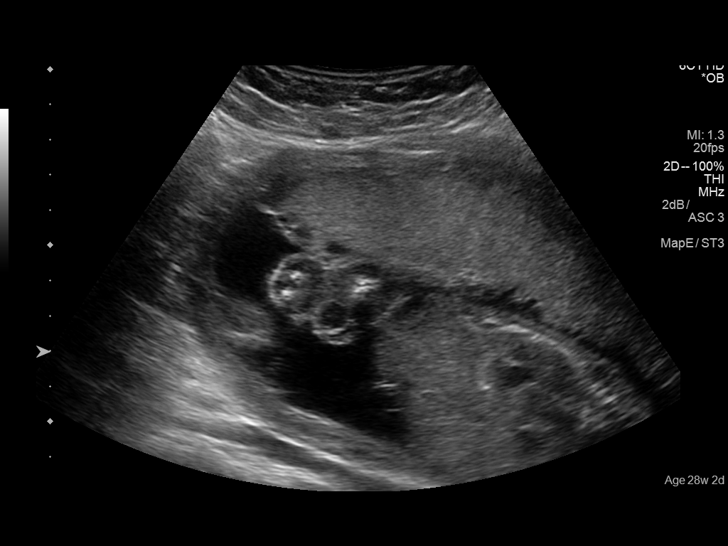
[im 12/30]
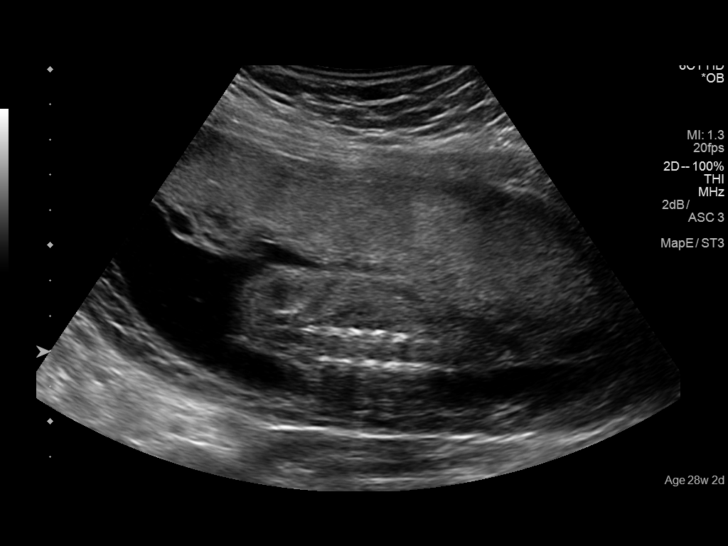
[im 14/30]
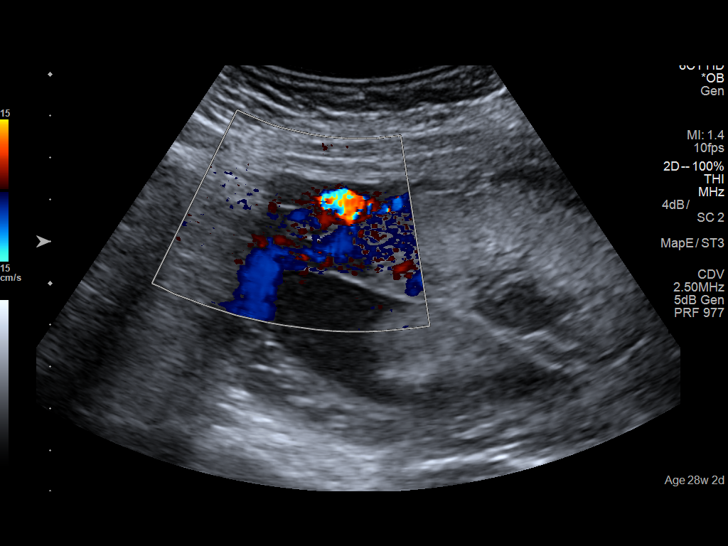
[im 17/30]
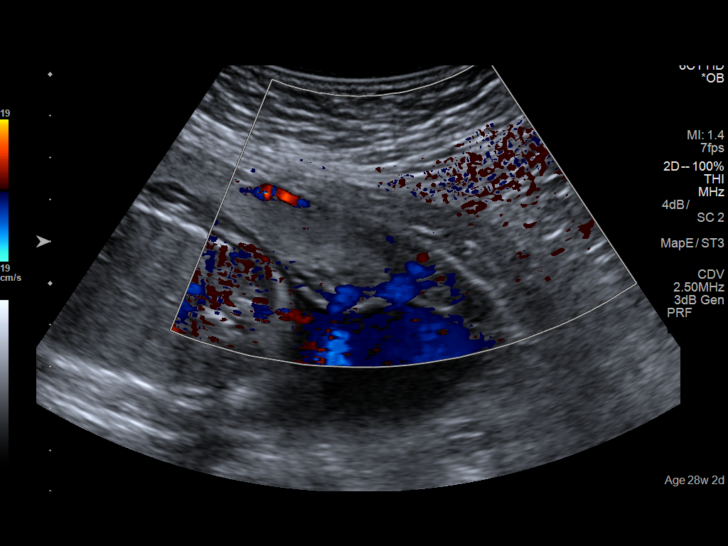
[im 19/30]
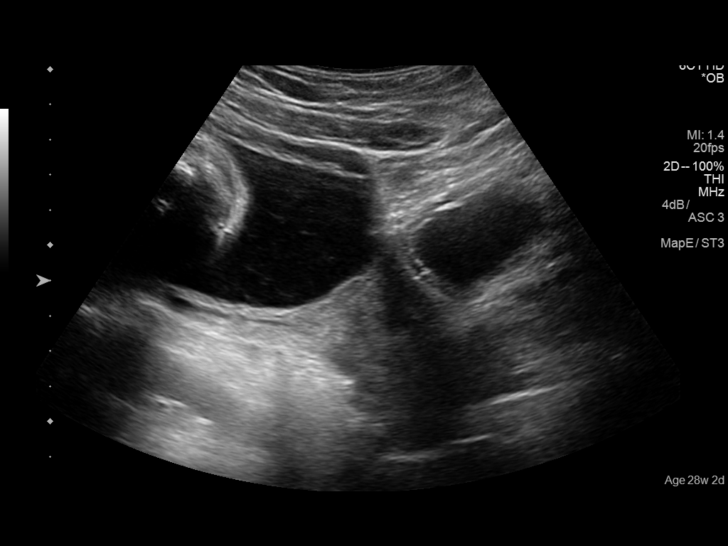
[im 21/30]
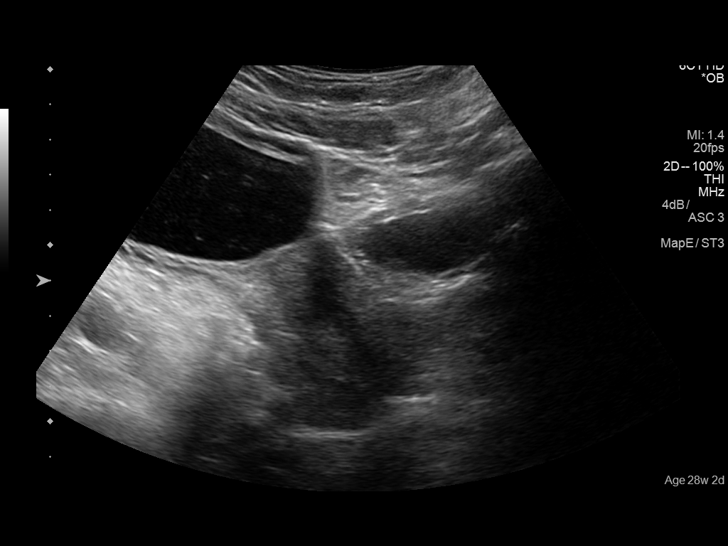
[im 23/30]
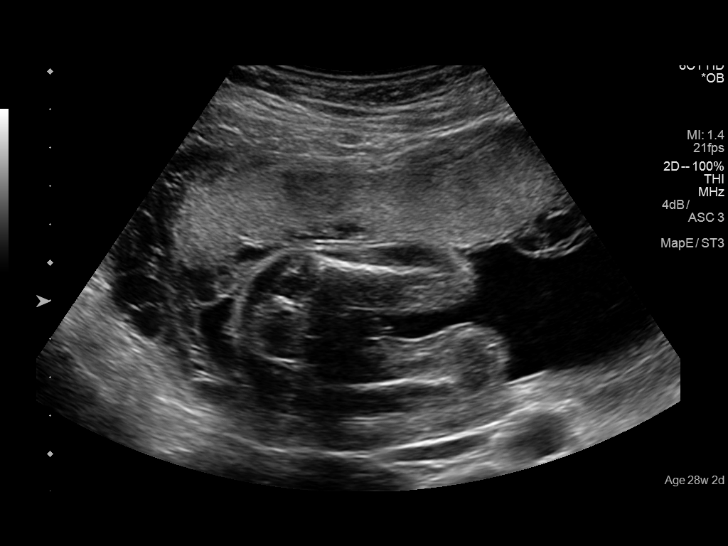
[im 25/30]
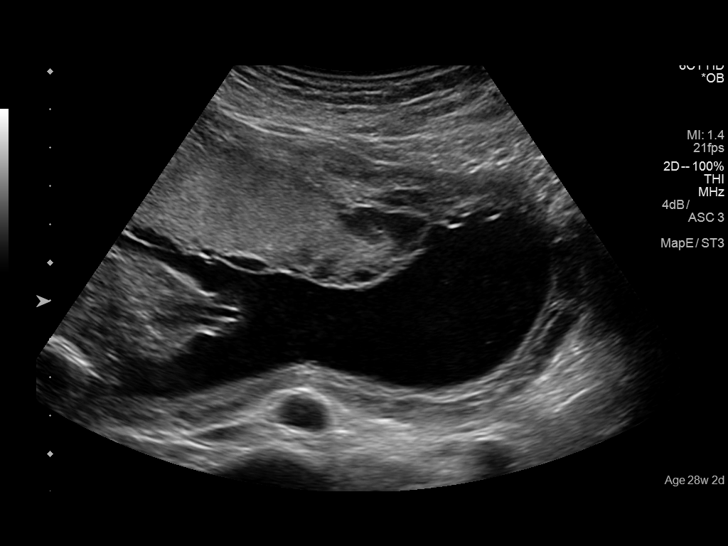
[im 27/30]
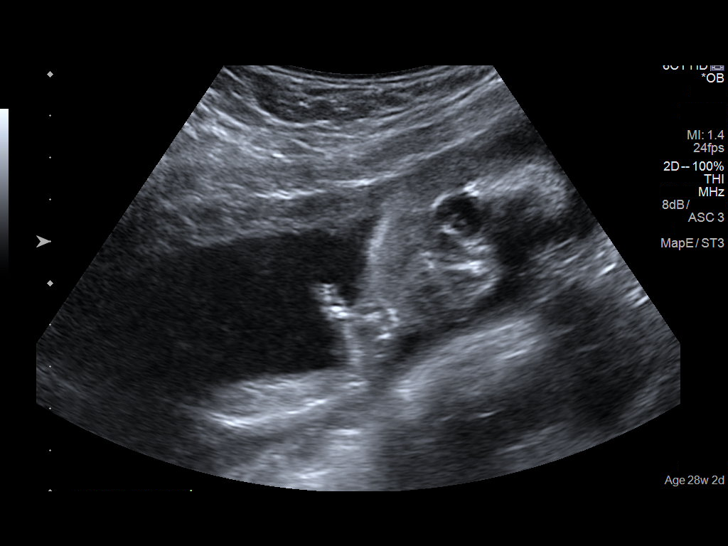
[im 30/30]
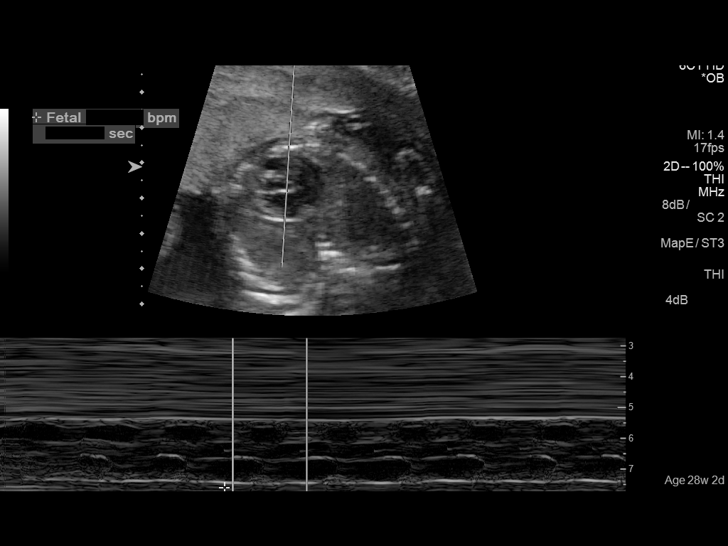

[14 of 28 positions shown; findings below may reference images not displayed]

FINDINGS: Number of Fetuses:  1

Heart Rate:  145 bpm

Movement:  Present

Presentation: Cephalic

Placental Location: Anterior

Previa: No

Amniotic Fluid (Subjective):  Within normal limits.

BPD:  5.53cm 22w  6d

MATERNAL FINDINGS:

Cervix:  Appears closed.

Uterus/Adnexae:  No abnormality visualized.
IMPRESSION: Single live intrauterine pregnancy corresponding with gestational
age of 22 weeks 6 days based on BPD only. No abnormalities
identified.

This exam is performed on an emergent basis and does not
comprehensively evaluate fetal size, dating, or anatomy; follow-up
complete OB US should be considered if further fetal assessment is
warranted.

## 2018-08-12 ENCOUNTER — Other Ambulatory Visit: Payer: Self-pay

## 2018-08-12 ENCOUNTER — Encounter (HOSPITAL_COMMUNITY): Payer: Self-pay

## 2018-08-12 ENCOUNTER — Emergency Department (HOSPITAL_COMMUNITY)
Admission: EM | Admit: 2018-08-12 | Discharge: 2018-08-12 | Disposition: A | Discharge status: Home / Self Care | Payer: Medicaid Other | Attending: Emergency Medicine | Admitting: Emergency Medicine

## 2018-08-12 DIAGNOSIS — L539 Erythematous condition, unspecified: Secondary | ICD-10-CM | POA: Diagnosis present

## 2018-08-12 DIAGNOSIS — F17210 Nicotine dependence, cigarettes, uncomplicated: Secondary | ICD-10-CM | POA: Insufficient documentation

## 2018-08-12 DIAGNOSIS — L03012 Cellulitis of left finger: Secondary | ICD-10-CM | POA: Diagnosis not present

## 2018-08-12 MED ORDER — SULFAMETHOXAZOLE-TRIMETHOPRIM 800-160 MG PO TABS: 1.0000 | ORAL_TABLET | Freq: Two times a day (BID) | ORAL | 0 refills | Status: DC

## 2018-08-12 MED ORDER — HYDROCODONE-ACETAMINOPHEN 5-325 MG PO TABS
1.0000 | ORAL_TABLET | Freq: Once | ORAL | Status: AC
  Administered 2018-08-12: 20:00:00 1 via ORAL
  Filled 2018-08-12: qty 1

## 2018-08-12 MED ORDER — SULFAMETHOXAZOLE-TRIMETHOPRIM 800-160 MG PO TABS
1.0000 | ORAL_TABLET | Freq: Once | ORAL | Status: AC
Start: 2018-08-12 — End: 2018-08-12
  Administered 2018-08-12: 1 via ORAL
  Filled 2018-08-12: qty 1

## 2018-08-12 NOTE — ED Notes (Signed)
Called for patient unable to locate pt to bring back to room.

## 2018-08-12 NOTE — ED Triage Notes (Signed)
Pt has swelling to tip of right middle finger. Pt states it has been this bad x3 days. Pt states that she first noticed it about a week ago. Pt states she hit it on a headboard

## 2018-08-12 NOTE — Discharge Instructions (Signed)
Soak area 20 minutes 4 times a day.  Recheck at Urgent care in 2 days if not improving

## 2018-08-13 NOTE — ED Provider Notes (Signed)
[facility]-EMERGENCY DEPT Provider Note   CSN: [id number] Arrival date & time: 08/12/18  1633     History   Chief Complaint Chief Complaint  Patient presents with  . Hand Pain    HPI Brianna Jarvis is a 28 y.o. female.     Pt reports she pulled a hangnail and now finger is infected.   The history is provided by the patient. No language interpreter was used.  Hand Pain This is a new problem. The current episode started 2 days ago. The problem occurs constantly. The problem has been gradually worsening. Nothing aggravates the symptoms. Nothing relieves the symptoms.    History reviewed. No pertinent past medical history.  There are no active problems to display for this patient.   History reviewed. No pertinent surgical history.   OB History    Gravida  3   Para  2   Term  2   Preterm      AB      Living  2     SAB      TAB      Ectopic      Multiple      Live Births               Home Medications    Prior to Admission medications   Medication Sig Start Date End Date Taking? Authorizing Provider  acetaminophen (TYLENOL) 500 MG tablet Take 2 tablets (1,000 mg total) by mouth every 6 (six) hours as needed. 10/30/15   Arby BarrettePfeiffer, Marcy, MD  amoxicillin (AMOXIL) 500 MG capsule Take 1 capsule (500 mg total) by mouth 3 (three) times daily. Patient not taking: Reported on 10/30/2015 10/11/15   Pisciotta, Joni ReiningNicole, PA-C  oxyCODONE-acetaminophen (PERCOCET) 5-325 MG tablet Take 1 tablet by mouth every 4 (four) hours as needed. Patient not taking: Reported on 10/30/2015 10/11/15   Pisciotta, Joni ReiningNicole, PA-C  Prenatal Vit-Fe Fumarate-FA (PRENATAL MULTIVITAMIN) TABS tablet Take 1 tablet by mouth daily at 12 noon.    [provider]  sulfamethoxazole-trimethoprim (BACTRIM DS) 800-160 MG tablet Take 1 tablet by mouth 2 (two) times daily. 08/12/18   Elson AreasSofia, Marylan Glore K, PA-C    Family History No family history on file.  Social History  Social History   Tobacco Use  . Smoking status: Current Every Day Smoker    Packs/day: 0.25    Types: Cigarettes  . Smokeless tobacco: Current User  Substance Use Topics  . Alcohol use: No  . Drug use: No     Allergies   Patient has no known allergies.   Review of Systems Review of Systems  All other systems reviewed and are negative.    Physical Exam Updated Vital Signs BP (!) 164/90 (BP Location: Left Arm)   Pulse 72   Temp 98 F (36.7 C) (Oral)   Resp 20   Wt 88.5 kg   LMP 07/22/2018   SpO2 100%   BMI 31.47 kg/m   Physical Exam Vitals signs reviewed.  HENT:     Head: Normocephalic.  Cardiovascular:     Rate and Rhythm: Normal rate.  Pulmonary:     Effort: Pulmonary effort is normal.  Musculoskeletal:        General: Swelling and tenderness present.     Comments: Swelling nail crease right 3rd finger.  Fluctuant nv and ns intact   Skin:    General: Skin is warm.  Neurological:     General: No focal deficit present.     Mental  Status: She is alert.  Psychiatric:        Mood and Affect: Mood normal.      ED Treatments / Results  Labs (all labs ordered are listed, but only abnormal results are displayed) Labs Reviewed - No data to display  EKG None  Radiology No results found.  Procedures .Marland KitchenIncision and Drainage  Date/Time: 08/13/2018 11:45 PM Performed by: Fransico Meadow, PA-C Authorized by: Fransico Meadow, PA-C   Consent:    Consent obtained:  Verbal   Consent given by:  Patient   Risks discussed:  Bleeding Location:    Type:  Abscess   Size:  0.5   Location:  Upper extremity   Upper extremity location:  Finger   Finger location:  R long finger Pre-procedure details:    Skin preparation:  Betadine Anesthesia (see MAR for exact dosages):    Anesthesia method:  Topical application Procedure type:    Complexity:  Simple Procedure details:    Needle aspiration: yes     Needle size:  18 G   Drainage:  Purulent   Wound  treatment:  Wound left open Post-procedure details:    Patient tolerance of procedure:  Tolerated well, no immediate complications   (including critical care time)  Medications Ordered in ED Medications  HYDROcodone-acetaminophen (NORCO/VICODIN) 5-325 MG per tablet 1 tablet (1 tablet Oral Given 08/12/18 1931)  sulfamethoxazole-trimethoprim (BACTRIM DS) 800-160 MG per tablet 1 tablet (1 tablet Oral Given 08/12/18 1931)     Initial Impression / Assessment and Plan / ED Course  I have reviewed the triage vital signs and the nursing notes.  Pertinent labs & imaging results that were available during my care of the patient were reviewed by me and considered in my medical decision making (see chart for details).        MDM   Pt advised to soak finger,  Rx for bactrim.   Follow up with Urgent care for recheck if not resolved in 2 days    Final Clinical Impressions(s) / ED Diagnoses   Final diagnoses:  Paronychia of finger of left hand    ED Discharge Orders         Ordered    sulfamethoxazole-trimethoprim (BACTRIM DS) 800-160 MG tablet  2 times daily     08/12/18 1921        An After Visit Summary was printed and given to the patient.    Fransico Meadow, PA-C 08/13/18 2346    Julianne Rice, MD 08/17/18 [phone]

## 2018-10-07 ENCOUNTER — Other Ambulatory Visit: Payer: Self-pay

## 2018-10-07 ENCOUNTER — Emergency Department (HOSPITAL_COMMUNITY)
Admission: EM | Admit: 2018-10-07 | Discharge: 2018-10-07 | Disposition: A | Discharge status: Home / Self Care | Payer: Medicaid Other | Attending: Emergency Medicine | Admitting: Emergency Medicine

## 2018-10-07 ENCOUNTER — Emergency Department (HOSPITAL_COMMUNITY): Payer: Medicaid Other

## 2018-10-07 ENCOUNTER — Encounter (HOSPITAL_COMMUNITY): Payer: Self-pay | Admitting: Emergency Medicine

## 2018-10-07 DIAGNOSIS — M25532 Pain in left wrist: Secondary | ICD-10-CM | POA: Diagnosis present

## 2018-10-07 DIAGNOSIS — F17210 Nicotine dependence, cigarettes, uncomplicated: Secondary | ICD-10-CM | POA: Diagnosis not present

## 2018-10-07 DIAGNOSIS — Z79899 Other long term (current) drug therapy: Secondary | ICD-10-CM | POA: Insufficient documentation

## 2018-10-07 MED ORDER — KETOROLAC TROMETHAMINE 30 MG/ML IJ SOLN
30.0000 mg | Freq: Once | INTRAMUSCULAR | Status: AC
  Administered 2018-10-07: 01:00:00 30 mg via INTRAMUSCULAR
  Filled 2018-10-07: qty 1

## 2018-10-07 MED ORDER — NAPROXEN 500 MG PO TABS: 500.0000 mg | ORAL_TABLET | Freq: Two times a day (BID) | ORAL | 0 refills | Status: DC

## 2018-10-07 NOTE — Discharge Instructions (Addendum)
You were seen today for wrist pain.  This may be a sprain.  Use the splint for comfort.  Take naproxen as needed for pain.

## 2018-10-07 NOTE — ED Provider Notes (Signed)
[facility] EMERGENCY DEPARTMENT Provider Note   CSN: [id number] Arrival date & time: 10/07/18  0022     History   Chief Complaint Chief Complaint  Patient presents with  . Wrist Pain    HPI Brianna Jarvis is a 28 y.o. female.     HPI  This is a 28 year old female who presents with left wrist pain.  Patient is right-handed.  Patient reports onset of wrist pain after leaving work yesterday.  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  She denies any obvious injury.  Pain is over the lateral aspect of the wrist.  Worse with range of motion.  Patient reports no pain when still.  She has not taken anything for her symptoms.  Denies numbness or tingling of the hand.  History reviewed. No pertinent past medical history.  There are no active problems to display for this patient.   History reviewed. No pertinent surgical history.   OB History    Gravida  3   Para  2   Term  2   Preterm      AB      Living  2     SAB      TAB      Ectopic      Multiple      Live Births               Home Medications    Prior to Admission medications   Medication Sig Start Date End Date Taking? Authorizing Provider  acetaminophen (TYLENOL) 500 MG tablet Take 2 tablets (1,000 mg total) by mouth every 6 (six) hours as needed. 10/30/15   Arby Barrette, MD  amoxicillin (AMOXIL) 500 MG capsule Take 1 capsule (500 mg total) by mouth 3 (three) times daily. Patient not taking: Reported on 10/30/2015 10/11/15   Pisciotta, Joni Reining, PA-C  naproxen (NAPROSYN) 500 MG tablet Take 1 tablet (500 mg total) by mouth 2 (two) times daily. 10/07/18   Horton, Mayer Masker, MD  oxyCODONE-acetaminophen (PERCOCET) 5-325 MG tablet Take 1 tablet by mouth every 4 (four) hours as needed. Patient not taking: Reported on 10/30/2015 10/11/15   Pisciotta, Joni Reining, PA-C  Prenatal Vit-Fe Fumarate-FA (PRENATAL MULTIVITAMIN) TABS tablet Take 1 tablet by mouth daily at 12 noon.    [provider]   sulfamethoxazole-trimethoprim (BACTRIM DS) 800-160 MG tablet Take 1 tablet by mouth 2 (two) times daily. 08/12/18   Elson Areas, PA-C    Family History No family history on file.  Social History Social History   Tobacco Use  . Smoking status: Current Every Day Smoker    Packs/day: 0.25    Types: Cigarettes  . Smokeless tobacco: Current User  Substance Use Topics  . Alcohol use: No  . Drug use: No     Allergies   Patient has no known allergies.   Review of Systems Review of Systems  Musculoskeletal:       Wrist pain  Skin: Negative for color change and wound.  Neurological: Negative for numbness.  All other systems reviewed and are negative.    Physical Exam Updated Vital Signs BP (!) 155/105 (BP Location: Right Arm)   Pulse 71   Temp 98.7 F (37.1 C) (Oral)   Resp 16   Ht 1.676 m (5\' 6" )   Wt 78.2 kg   LMP 09/20/2018   SpO2 100%   BMI 27.81 kg/m   Physical Exam Vitals signs and nursing note reviewed.  Constitutional:  Appearance: She is well-developed.  HENT:     Head: Normocephalic and atraumatic.  Neck:     Musculoskeletal: Neck supple.  Cardiovascular:     Rate and Rhythm: Normal rate and regular rhythm.  Pulmonary:     Effort: Pulmonary effort is normal. No respiratory distress.  Musculoskeletal:     Comments: Tenderness to palpation over the lateral aspect of the left wrist, no significant swelling or deformity noted, normal range of motion of the wrist and flexion and extension of all 5 digits, 2+ radial pulse  Skin:    General: Skin is warm and dry.     Findings: No erythema.  Neurological:     Mental Status: She is alert and oriented to person, place, and time.  Psychiatric:        Mood and Affect: Mood normal.      ED Treatments / Results  Labs (all labs ordered are listed, but only abnormal results are displayed) Labs Reviewed - No data to display  EKG None  Radiology Dg Wrist Complete Left  Result Date: 10/07/2018  CLINICAL DATA:  Pain and swelling to the dorsum of the left wrist EXAM: LEFT WRIST - COMPLETE 3+ VIEW COMPARISON:  None. FINDINGS: There is no evidence of fracture or dislocation. There is no evidence of arthropathy or other focal bone abnormality. Soft tissues are unremarkable. IMPRESSION: Negative. Electronically Signed   By: Lovena Le M.D.   On: 10/07/2018 02:15    Procedures Procedures (including critical care time)  Medications Ordered in ED Medications  ketorolac (TORADOL) 30 MG/ML injection 30 mg (30 mg Intramuscular Given 10/07/18 0129)     Initial Impression / Assessment and Plan / ED Course  I have reviewed the triage vital signs and the nursing notes.  Pertinent labs & imaging results that were available during my care of the patient were reviewed by me and considered in my medical decision making (see chart for details).        Patient presents with left wrist pain.  Exam is fairly unremarkable with the exception of tenderness to palpation.  No deformity.  X-rays are negative for fracture.  Suspect sprain versus repetitive motion injury versus carpal tunnel.  Recommend rice therapy.  Additionally patient was provided with a splint.  After history, exam, and medical workup I feel the patient has been appropriately medically screened and is safe for discharge home. Pertinent diagnoses were discussed with the patient. Patient was given return precautions.   Final Clinical Impressions(s) / ED Diagnoses   Final diagnoses:  Acute pain of left wrist    ED Discharge Orders         Ordered    naproxen (NAPROSYN) 500 MG tablet  2 times daily     10/07/18 0219           Merryl Hacker, MD 10/07/18 [phone]

## 2018-10-07 NOTE — ED Notes (Signed)
Pt ambulatory to waiting room. Pt verbalized understanding of discharge instructions.   

## 2018-10-07 NOTE — ED Triage Notes (Signed)
Pt C/O painful knot on the left wrist.

## 2018-11-05 ENCOUNTER — Emergency Department (HOSPITAL_COMMUNITY)
Admission: EM | Admit: 2018-11-05 | Discharge: 2018-11-05 | Disposition: A | Discharge status: Home / Self Care | Payer: Medicaid Other | Attending: Emergency Medicine | Admitting: Emergency Medicine

## 2018-11-05 ENCOUNTER — Emergency Department (HOSPITAL_COMMUNITY): Payer: Medicaid Other

## 2018-11-05 ENCOUNTER — Other Ambulatory Visit: Payer: Self-pay

## 2018-11-05 ENCOUNTER — Encounter (HOSPITAL_COMMUNITY): Payer: Self-pay

## 2018-11-05 DIAGNOSIS — R102 Pelvic and perineal pain: Secondary | ICD-10-CM | POA: Diagnosis not present

## 2018-11-05 DIAGNOSIS — F17210 Nicotine dependence, cigarettes, uncomplicated: Secondary | ICD-10-CM | POA: Insufficient documentation

## 2018-11-05 DIAGNOSIS — R1032 Left lower quadrant pain: Secondary | ICD-10-CM | POA: Diagnosis present

## 2018-11-05 HISTORY — DX: Unspecified ovarian cyst, unspecified side: N83.209

## 2018-11-05 HISTORY — DX: Cardiac murmur, unspecified: R01.1

## 2018-11-05 LAB — COMPREHENSIVE METABOLIC PANEL
ALT: 13 U/L (ref 0–44)
AST: 14 U/L — ABNORMAL LOW (ref 15–41)
Albumin: 4.1 g/dL (ref 3.5–5.0)
Alkaline Phosphatase: 57 U/L (ref 38–126)
Anion gap: 10 (ref 5–15)
BUN: 7 mg/dL (ref 6–20)
CO2: 22 mmol/L (ref 22–32)
Calcium: 9.2 mg/dL (ref 8.9–10.3)
Chloride: 107 mmol/L (ref 98–111)
Creatinine, Ser: 0.52 mg/dL (ref 0.44–1.00)
GFR calc Af Amer: >60 mL/min (ref >60)
GFR calc non Af Amer: >60 mL/min (ref >60)
Glucose, Bld: 85 mg/dL (ref 70–99)
Potassium: 3.6 mmol/L (ref 3.5–5.1)
Sodium: 139 mmol/L (ref 135–145)
Total Bilirubin: 0.9 mg/dL (ref 0.3–1.2)
Total Protein: 7.2 g/dL (ref 6.5–8.1)

## 2018-11-05 LAB — URINALYSIS, ROUTINE W REFLEX MICROSCOPIC
Bacteria, UA: NONE SEEN
Bilirubin Urine: NEGATIVE
Glucose, UA: NEGATIVE mg/dL
Hgb urine dipstick: NEGATIVE
Ketones, ur: NEGATIVE mg/dL
Nitrite: NEGATIVE
Protein, ur: NEGATIVE mg/dL
Specific Gravity, Urine: 1.005 (ref 1.005–1.030)
pH: 6 (ref 5.0–8.0)

## 2018-11-05 LAB — CBC WITH DIFFERENTIAL/PLATELET
Abs Immature Granulocytes: 0.02 10*3/uL (ref 0.00–0.07)
Basophils Absolute: 0 10*3/uL (ref 0.0–0.1)
Basophils Relative: 0 %
Eosinophils Absolute: 0.3 10*3/uL (ref 0.0–0.5)
Eosinophils Relative: 2 %
HCT: 34.5 % — ABNORMAL LOW (ref 36.0–46.0)
Hemoglobin: 10.3 g/dL — ABNORMAL LOW (ref 12.0–15.0)
Immature Granulocytes: 0 %
Lymphocytes Relative: 34 %
Lymphs Abs: 4.1 10*3/uL — ABNORMAL HIGH (ref 0.7–4.0)
MCH: 20 pg — ABNORMAL LOW (ref 26.0–34.0)
MCHC: 29.9 g/dL — ABNORMAL LOW (ref 30.0–36.0)
MCV: 67.1 fL — ABNORMAL LOW (ref 80.0–100.0)
Monocytes Absolute: 0.7 10*3/uL (ref 0.1–1.0)
Monocytes Relative: 6 %
Neutro Abs: 6.8 10*3/uL (ref 1.7–7.7)
Neutrophils Relative %: 58 %
Platelets: 168 10*3/uL (ref 150–400)
RBC: 5.14 MIL/uL — ABNORMAL HIGH (ref 3.87–5.11)
RDW: 17.2 % — ABNORMAL HIGH (ref 11.5–15.5)
WBC: 11.9 10*3/uL — ABNORMAL HIGH (ref 4.0–10.5)
nRBC: 0 % (ref 0.0–0.2)

## 2018-11-05 LAB — I-STAT BETA HCG BLOOD, ED (MC, WL, AP ONLY): I-stat hCG, quantitative: <5 m[IU]/mL (ref <5)

## 2018-11-05 LAB — WET PREP, GENITAL
Clue Cells Wet Prep HPF POC: NONE SEEN
Sperm: NONE SEEN
Trich, Wet Prep: NONE SEEN
Yeast Wet Prep HPF POC: NONE SEEN

## 2018-11-05 LAB — LIPASE, BLOOD: Lipase: 27 U/L (ref 11–51)

## 2018-11-05 MED ORDER — AZITHROMYCIN 250 MG PO TABS
1000.0000 mg | ORAL_TABLET | Freq: Once | ORAL | Status: AC
  Administered 2018-11-05: 1000 mg via ORAL
  Filled 2018-11-05: qty 4

## 2018-11-05 MED ORDER — CEFTRIAXONE SODIUM 250 MG IJ SOLR
250.0000 mg | Freq: Once | INTRAMUSCULAR | Status: AC
  Administered 2018-11-05: 14:00:00 250 mg via INTRAMUSCULAR
  Filled 2018-11-05: qty 250

## 2018-11-05 MED ORDER — STERILE WATER FOR INJECTION IJ SOLN
INTRAMUSCULAR | Status: AC
  Administered 2018-11-05: 1 mL
  Filled 2018-11-05: qty 10

## 2018-11-05 NOTE — ED Triage Notes (Signed)
Pt reports that she left abdominal pain since last night. Pt reports she has a hx of cyst. No discharge per pt. No urinary symptoms

## 2018-11-05 NOTE — Discharge Instructions (Addendum)
You will be notified of any positive culture results.  You have been given antibiotics today for pelvic infection.  Call [facility] to establish care and arrange follow-up.  Return to the ER for any worsening symptoms such as increasing pain, fever, or vomiting.  Take your naproxen as directed if needed for pain.

## 2018-11-05 NOTE — ED Provider Notes (Signed)
[facility] EMERGENCY DEPARTMENT Provider Note   CSN: [id number] Arrival date & time: 11/05/18  1002     History   Chief Complaint Chief Complaint  Patient presents with  . Abdominal Pain    HPI Brianna Jarvis is a 28 y.o. female.     HPI   Brianna Jarvis is a 28 y.o. female who presents to the Emergency Department complaining of left lower pelvic pain that began last night.  She states the pain was minimal last night and she woke this morning with pain increasing in intensity.  she describes the pain as sharp and nonradiating.  She states that she has had an ovarian cyst in the past and is concerned her pain today is the same as previous.  She is also concerned that she may be pregnant stating that she does not use birth control.  She denies pain associated with food intake, dysuria, fever, chills, vomiting or diarrhea.  She also denies abnormal menses or vaginal discharge.  Pain is associated with movement and improves at rest.   Past Medical History:  Diagnosis Date  . Heart murmur   . Ovarian cyst     There are no active problems to display for this patient.   History reviewed. No pertinent surgical history.   OB History    Gravida  3   Para  2   Term  2   Preterm      AB      Living  2     SAB      TAB      Ectopic      Multiple      Live Births               Home Medications    Prior to Admission medications   Medication Sig Start Date End Date Taking? Authorizing Provider  naproxen (NAPROSYN) 500 MG tablet Take 1 tablet (500 mg total) by mouth 2 (two) times daily. Patient taking differently: Take 500 mg by mouth 2 (two) times daily as needed.  10/07/18  Yes Horton, Mayer Maskerourtney F, MD    Family History No family history on file.  Social History Social History   Tobacco Use  . Smoking status: Current Every Day Smoker    Packs/day: 0.25    Types: Cigarettes  . Smokeless tobacco: Current User  Substance Use Topics  . Alcohol use:  No  . Drug use: No     Allergies   Patient has no known allergies.   Review of Systems Review of Systems  Constitutional: Negative for activity change, appetite change and fever.  Respiratory: Negative for cough, chest tightness and shortness of breath.   Cardiovascular: Negative for chest pain.  Gastrointestinal: Negative for abdominal pain, diarrhea, nausea and vomiting.  Genitourinary: Positive for pelvic pain. Negative for dysuria, flank pain, vaginal bleeding, vaginal discharge and vaginal pain.  Musculoskeletal: Negative for back pain.  Skin: Negative for rash.  Neurological: Negative for weakness and numbness.     Physical Exam Updated Vital Signs BP (!) 143/106 (BP Location: Right Arm)   Pulse 84   Temp 98.2 F (36.8 C) (Oral)   Resp 16   Ht 5\' 6"  (1.676 m)   Wt 77.1 kg   LMP 10/20/2018   SpO2 99%   BMI 27.44 kg/m   Physical Exam Vitals signs and nursing note reviewed. Exam conducted with a chaperone present.  Constitutional:      Appearance: Normal appearance. She is well-developed. She is  not ill-appearing or toxic-appearing.  HENT:     Mouth/Throat:     Mouth: Mucous membranes are moist.  Cardiovascular:     Rate and Rhythm: Normal rate and regular rhythm.     Pulses: Normal pulses.  Pulmonary:     Effort: Pulmonary effort is normal.     Breath sounds: Normal breath sounds.  Chest:     Chest wall: No tenderness.  Abdominal:     General: There is no distension.     Palpations: Abdomen is soft. There is no mass.     Tenderness: There is abdominal tenderness in the left lower quadrant. There is no right CVA tenderness or left CVA tenderness.     Comments: Mild LLQ tenderness.  No guarding or rebound tenderness.   Genitourinary:    Labia:        Right: No rash or tenderness.        Left: No rash or tenderness.      Cervix: Cervical motion tenderness present. No erythema or cervical bleeding.     Adnexa:        Right: No mass or tenderness.          Left: No mass or tenderness.       Comments: CMT on bimanual exam.  No significant vaginal discharge.  No adnexal tenderness or masses on exam. Uterus is anteverted  Musculoskeletal: Normal range of motion.  Skin:    General: Skin is warm.     Capillary Refill: Capillary refill takes less than 2 seconds.     Findings: No rash.  Neurological:     General: No focal deficit present.     Mental Status: She is alert.     Sensory: No sensory deficit.     Motor: No weakness.      ED Treatments / Results  Labs (all labs ordered are listed, but only abnormal results are displayed) Labs Reviewed  WET PREP, GENITAL - Abnormal; Notable for the following components:      Result Value   WBC, Wet Prep HPF POC FEW (*)    All other components within normal limits  URINALYSIS, ROUTINE W REFLEX MICROSCOPIC - Abnormal; Notable for the following components:   Color, Urine STRAW (*)    Leukocytes,Ua SMALL (*)    All other components within normal limits  COMPREHENSIVE METABOLIC PANEL - Abnormal; Notable for the following components:   AST 14 (*)    All other components within normal limits  CBC WITH DIFFERENTIAL/PLATELET - Abnormal; Notable for the following components:   WBC 11.9 (*)    RBC 5.14 (*)    Hemoglobin 10.3 (*)    HCT 34.5 (*)    MCV 67.1 (*)    MCH 20.0 (*)    MCHC 29.9 (*)    RDW 17.2 (*)    Lymphs Abs 4.1 (*)    All other components within normal limits  URINE CULTURE  LIPASE, BLOOD  I-STAT BETA HCG BLOOD, ED ([facility], [facility], [facility] ONLY)  GC/CHLAMYDIA PROBE AMP ([facility]) NOT AT [facility]    EKG None  Radiology US Pelvic Complete W Transvaginal And Torsion R/o  Result Date: 11/05/2018 CLINICAL DATA:  28 year old female presents with 1 day of left pelvic pain. LMP 10/20/2018. EXAM: TRANSABDOMINAL AND TRANSVAGINAL ULTRASOUND OF PELVIS DOPPLER ULTRASOUND OF OVARIES TECHNIQUE: Both transabdominal and transvaginal ultrasound examinations of the pelvis were performed. Transabdominal  technique was performed for global imaging of the pelvis including uterus, ovaries, adnexal regions, and pelvic cul-de-sac.  It was necessary to proceed with endovaginal exam following the transabdominal exam to visualize the endometrium and adnexa. Color and duplex Doppler ultrasound was utilized to evaluate blood flow to the ovaries. COMPARISON:  None. FINDINGS: Uterus Measurements: 8.9 x 4.7 x 5.3 cm = volume: 118 mL. Anteverted uterus is normal in size and configuration, with no uterine fibroids or other myometrial abnormalities. Endometrium Thickness: 1 mm. No endometrial cavity fluid or focal endometrial mass. Right ovary Measurements: 3.5 x 1.9 x 2.2 cm = volume: 7.7 mL. Normal appearance/no adnexal mass. Left ovary Measurements: 3.5 x 1.8 x 2.8 cm = volume: 9.1 mL. Normal appearance/no adnexal mass. Pulsed Doppler evaluation of both ovaries demonstrates normal low-resistance arterial and venous waveforms. Other findings No abnormal free fluid. IMPRESSION: 1. Normal ovaries. No evidence of adnexal torsion. No adnexal masses. 2. Normal anteverted uterus. Electronically Signed   By: Ilona Sorrel M.D.   On: 11/05/2018 11:50    Procedures Procedures (including critical care time)  Medications Ordered in ED Medications  cefTRIAXone (ROCEPHIN) injection 250 mg (250 mg Intramuscular Given 11/05/18 1402)  azithromycin (ZITHROMAX) tablet 1,000 mg (1,000 mg Oral Given 11/05/18 1400)  sterile water (preservative free) injection (1 mL  Given 11/05/18 1402)     Initial Impression / Assessment and Plan / ED Course  I have reviewed the triage vital signs and the nursing notes.  Pertinent labs & imaging results that were available during my care of the patient were reviewed by me and considered in my medical decision making (see chart for details).        Pt with left pelvic pain and CMT on exam.  Pelvic US is reassuring.  No concerning sx's for acute abdomen.  U/A shows some WBC's and leukocytes, urine  culture is pending.  I will refrain from treating for cystitis since pt does not have dysuria sx's.  Pain felt to be related to PID. I will treat with rocephin and zithromax.  GC and Chlamydia cultures pending.  Pt appears appropriate for d/c home.  Will f[facility]u with [facility], strict return precautions discussed.    Final Clinical Impressions(s) / ED Diagnoses   Final diagnoses:  Pelvic pain    ED Discharge Orders    None       Kem Parkinson, PA-C 11/07/18 0829    Nat Christen, MD 11/08/18 1003

## 2018-11-07 LAB — URINE CULTURE: Culture: <10000 — AB

## 2018-11-07 LAB — GC/CHLAMYDIA PROBE AMP (~~LOC~~) NOT AT ARMC
Chlamydia: NEGATIVE
Neisseria Gonorrhea: NEGATIVE

## 2019-01-17 ENCOUNTER — Ambulatory Visit: Payer: Medicaid Other | Admitting: Family Medicine

## 2019-01-29 ENCOUNTER — Ambulatory Visit: Payer: Medicaid Other | Admitting: Family Medicine

## 2019-04-15 ENCOUNTER — Emergency Department (HOSPITAL_COMMUNITY): Payer: Medicaid Other

## 2019-04-15 ENCOUNTER — Emergency Department (HOSPITAL_COMMUNITY)
Admission: EM | Admit: 2019-04-15 | Discharge: 2019-04-15 | Disposition: A | Discharge status: Home / Self Care | Payer: Medicaid Other | Attending: Emergency Medicine | Admitting: Emergency Medicine

## 2019-04-15 ENCOUNTER — Other Ambulatory Visit: Payer: Self-pay

## 2019-04-15 ENCOUNTER — Encounter (HOSPITAL_COMMUNITY): Payer: Self-pay | Admitting: Emergency Medicine

## 2019-04-15 DIAGNOSIS — O4690 Antepartum hemorrhage, unspecified, unspecified trimester: Secondary | ICD-10-CM

## 2019-04-15 DIAGNOSIS — R102 Pelvic and perineal pain: Secondary | ICD-10-CM | POA: Insufficient documentation

## 2019-04-15 DIAGNOSIS — Z3A00 Weeks of gestation of pregnancy not specified: Secondary | ICD-10-CM | POA: Insufficient documentation

## 2019-04-15 DIAGNOSIS — O4691 Antepartum hemorrhage, unspecified, first trimester: Secondary | ICD-10-CM | POA: Insufficient documentation

## 2019-04-15 DIAGNOSIS — F17210 Nicotine dependence, cigarettes, uncomplicated: Secondary | ICD-10-CM | POA: Insufficient documentation

## 2019-04-15 DIAGNOSIS — O26891 Other specified pregnancy related conditions, first trimester: Secondary | ICD-10-CM | POA: Diagnosis present

## 2019-04-15 LAB — CBC
HCT: 36.8 % (ref 36.0–46.0)
Hemoglobin: 10.8 g/dL — ABNORMAL LOW (ref 12.0–15.0)
MCH: 20 pg — ABNORMAL LOW (ref 26.0–34.0)
MCHC: 29.3 g/dL — ABNORMAL LOW (ref 30.0–36.0)
MCV: 68 fL — ABNORMAL LOW (ref 80.0–100.0)
Platelets: 173 10*3/uL (ref 150–400)
RBC: 5.41 MIL/uL — ABNORMAL HIGH (ref 3.87–5.11)
RDW: 19.5 % — ABNORMAL HIGH (ref 11.5–15.5)
WBC: 10 10*3/uL (ref 4.0–10.5)
nRBC: 0 % (ref 0.0–0.2)

## 2019-04-15 LAB — BASIC METABOLIC PANEL
Anion gap: 7 (ref 5–15)
BUN: 6 mg/dL (ref 6–20)
CO2: 22 mmol/L (ref 22–32)
Calcium: 8.9 mg/dL (ref 8.9–10.3)
Chloride: 107 mmol/L (ref 98–111)
Creatinine, Ser: 0.74 mg/dL (ref 0.44–1.00)
GFR calc Af Amer: >60 mL/min (ref >60)
GFR calc non Af Amer: >60 mL/min (ref >60)
Glucose, Bld: 92 mg/dL (ref 70–99)
Potassium: 4.2 mmol/L (ref 3.5–5.1)
Sodium: 136 mmol/L (ref 135–145)

## 2019-04-15 LAB — URINALYSIS, ROUTINE W REFLEX MICROSCOPIC
Bilirubin Urine: NEGATIVE
Glucose, UA: NEGATIVE mg/dL
Ketones, ur: NEGATIVE mg/dL
Nitrite: NEGATIVE
Protein, ur: NEGATIVE mg/dL
Specific Gravity, Urine: 1.018 (ref 1.005–1.030)
pH: 5 (ref 5.0–8.0)

## 2019-04-15 LAB — WET PREP, GENITAL
Sperm: NONE SEEN
Trich, Wet Prep: NONE SEEN

## 2019-04-15 LAB — HCG, QUANTITATIVE, PREGNANCY: hCG, Beta Chain, Quant, S: 632 m[IU]/mL — ABNORMAL HIGH (ref <5)

## 2019-04-15 LAB — ABO/RH: ABO/RH(D): A POS

## 2019-04-15 NOTE — ED Notes (Signed)
Ultrasound at bedside

## 2019-04-15 NOTE — ED Provider Notes (Signed)
[facility] DEPT Provider Note   CSN: [id number] Arrival date & time: 04/15/19  1211     History Chief Complaint  Patient presents with  . Vaginal Bleeding    Brianna Jarvis is a 29 y.o. female.  Brianna Jarvis is a 30 y.o. female with a history of heart murmur and ovarian cyst, who presents to the ED for evaluation of vaginal bleeding.  She states that she took a home pregnancy test on Tuesday which was positive.  States that her last menstrual cycle was on February 1, based on LMP she would be approximately [redacted] weeks pregnant.  She reports she has had 3 previous successful pregnancies, no history of miscarriages.  Reports she started having vaginal bleeding on Wednesday and it has continued throughout the weekend.  She says it is fluctuated between heavy and light bleeding.  Today for the first time she passed a small blood clot.  She reports associated lower abdominal cramping.  No vaginal discharge.  No burning or pain with urination.  No fevers or chills.  She has not yet seen an OB/GYN for this pregnancy, states in the past she has been followed by the health department.  Denies any lightheadedness or syncope, no chest pain or shortness of breath.  No other aggravating or alleviating factors.        Past Medical History:  Diagnosis Date  . Heart murmur   . Ovarian cyst     There are no problems to display for this patient.   History reviewed. No pertinent surgical history.   OB History    Gravida  4   Para  2   Term  2   Preterm      AB      Living  2     SAB      TAB      Ectopic      Multiple      Live Births              No family history on file.  Social History   Tobacco Use  . Smoking status: Current Every Day Smoker    Packs/day: 0.25    Types: Cigarettes  . Smokeless tobacco: Current User  Substance Use Topics  . Alcohol use: No  . Drug use: No    Home Medications Prior to Admission medications     Medication Sig Start Date End Date Taking? Authorizing Provider  naproxen (NAPROSYN) 500 MG tablet Take 1 tablet (500 mg total) by mouth 2 (two) times daily. Patient not taking: Reported on 04/15/2019 10/07/18   Horton, Barbette Hair, MD    Allergies    Patient has no known allergies.  Review of Systems   Review of Systems  Constitutional: Negative for chills, fatigue and fever.  Respiratory: Negative for shortness of breath.   Cardiovascular: Negative for chest pain.  Gastrointestinal: Positive for abdominal pain. Negative for constipation, diarrhea, nausea and vomiting.  Genitourinary: Positive for pelvic pain and vaginal bleeding. Negative for dysuria, frequency and vaginal discharge.  Skin: Negative for color change and rash.  Neurological: Negative for syncope and light-headedness.    Physical Exam Updated Vital Signs BP 122/89 (BP Location: Left Arm)   Pulse 86   Temp 99 F (37.2 C) (Oral)   Resp 18   LMP 02/11/2019   SpO2 99%   Physical Exam Vitals and nursing note reviewed.  Constitutional:      General: She is not in acute distress.  Appearance: Normal appearance. She is well-developed. She is not ill-appearing or diaphoretic.  HENT:     Head: Normocephalic and atraumatic.  Eyes:     General:        Right eye: No discharge.        Left eye: No discharge.     Pupils: Pupils are equal, round, and reactive to light.  Cardiovascular:     Rate and Rhythm: Normal rate and regular rhythm.     Heart sounds: Normal heart sounds. No murmur. No friction rub. No gallop.   Pulmonary:     Effort: Pulmonary effort is normal. No respiratory distress.     Breath sounds: Normal breath sounds. No wheezing or rales.     Comments: Respirations equal and unlabored, patient able to speak in full sentences, lungs clear to auscultation bilaterally Abdominal:     General: Bowel sounds are normal. There is no distension.     Palpations: Abdomen is soft. There is no mass.      Tenderness: There is abdominal tenderness. There is no guarding.     Comments: Abdomen soft, nondistended, bowel sounds present throughout, there is mild tenderness across the lower abdomen without guarding or peritoneal signs  Genitourinary:    Comments: Chaperone present during pelvic exam. No external genital lesions noted. Pelvic exam with moderate amount of blood pooled in the vaginal vault, no clots noted.  Blood cleared with Fox swabs.  Slight amount of bleeding coming from the cervical os which is closed.  Bimanual exam with slight left-sided adnexal tenderness.  No palpable masses. Musculoskeletal:        General: No deformity.     Cervical back: Neck supple.  Skin:    General: Skin is warm and dry.     Capillary Refill: Capillary refill takes less than 2 seconds.  Neurological:     Mental Status: She is alert.     Coordination: Coordination normal.     Comments: Speech is clear, able to follow commands Moves extremities without ataxia, coordination intact  Psychiatric:        Mood and Affect: Mood normal.        Behavior: Behavior normal.     ED Results / Procedures / Treatments   Labs (all labs ordered are listed, but only abnormal results are displayed) Labs Reviewed  HCG, QUANTITATIVE, PREGNANCY - Abnormal; Notable for the following components:      Result Value   hCG, Beta Chain, Quant, S 632 (*)    All other components within normal limits  CBC - Abnormal; Notable for the following components:   RBC 5.41 (*)    Hemoglobin 10.8 (*)    MCV 68.0 (*)    MCH 20.0 (*)    MCHC 29.3 (*)    RDW 19.5 (*)    All other components within normal limits  URINALYSIS, ROUTINE W REFLEX MICROSCOPIC - Abnormal; Notable for the following components:   Hgb urine dipstick MODERATE (*)    Leukocytes,Ua SMALL (*)    Bacteria, UA RARE (*)    All other components within normal limits  WET PREP, GENITAL  BASIC METABOLIC PANEL  ABO/RH  GC/CHLAMYDIA PROBE AMP ([place]) NOT AT  [facility]    EKG None  Radiology US OB Comp < 14 Wks  Result Date: 04/15/2019 CLINICAL DATA:  Vaginal bleeding, lower abdominal cramping, early pregnancy EXAM: TRANSVAGINAL OB ULTRASOUND; OBSTETRIC <14 WK ULTRASOUND TECHNIQUE: Transvaginal ultrasound was performed for complete evaluation of the gestation as well as the  maternal uterus, adnexal regions, and pelvic cul-de-sac. COMPARISON:  11/05/2018 FINDINGS: Intrauterine gestational sac: None Yolk sac:  Not Visualized. Embryo:  Not Visualized. Cardiac Activity: Not Visualized. Heart Rate: Not applicable. Subchorionic hemorrhage:  None visualized. Maternal uterus/adnexae: Normal in appearance. Endometrial thickness 8 mm. IMPRESSION: 1. No candidate intrauterine gestation identified. Early pregnancy of unknown location. Recommend serial beta HCG, follow-up ultrasound, and ectopic precautions. 2. No adnexal mass or other findings suspicious for ectopic pregnancy. Electronically Signed   By: Lauralyn Primes M.D.   On: 04/15/2019 14:14   US OB Transvaginal  Result Date: 04/15/2019 CLINICAL DATA:  Vaginal bleeding, lower abdominal cramping, early pregnancy EXAM: TRANSVAGINAL OB ULTRASOUND; OBSTETRIC <14 WK ULTRASOUND TECHNIQUE: Transvaginal ultrasound was performed for complete evaluation of the gestation as well as the maternal uterus, adnexal regions, and pelvic cul-de-sac. COMPARISON:  11/05/2018 FINDINGS: Intrauterine gestational sac: None Yolk sac:  Not Visualized. Embryo:  Not Visualized. Cardiac Activity: Not Visualized. Heart Rate: Not applicable. Subchorionic hemorrhage:  None visualized. Maternal uterus/adnexae: Normal in appearance. Endometrial thickness 8 mm. IMPRESSION: 1. No candidate intrauterine gestation identified. Early pregnancy of unknown location. Recommend serial beta HCG, follow-up ultrasound, and ectopic precautions. 2. No adnexal mass or other findings suspicious for ectopic pregnancy. Electronically Signed   By: Lauralyn Primes M.D.   On:  04/15/2019 14:14    Procedures Procedures (including critical care time)  Medications Ordered in ED Medications - No data to display  ED Course  I have reviewed the triage vital signs and the nursing notes.  Pertinent labs & imaging results that were available during my care of the patient were reviewed by me and considered in my medical decision making (see chart for details).    MDM Rules/Calculators/A&P                     29 year old female presents with vaginal bleeding with positive home pregnancy test.  Has history of 3 previous pregnancies, no history of miscarriage.  Also reports associated lower abdominal cramping.  On arrival she is well-appearing with normal vital signs.  On exam she has a moderate amount of blood pooled in the vaginal vault but no active bleeding and cervical os is closed.  Will get basic labs, ABO/Rh, UA, wet prep and GC chlamydia.  We will also get pelvic ultrasound to rule out ectopic pregnancy.  hCG is 632, by LMP patient would be [redacted] weeks pregnant but this is only consistent with about 3 weeks of pregnancy.  On ultrasound there is no visualized IUP, this could be too early in pregnancy to tell versus miscarriage versus ectopic pregnancy.  Her lab work is stable today, hemoglobin of 10.8, previously 10.3, no leukocytosis and no electrolyte derangements.  Will discharge patient home with follow-up for repeat hCG level in 2 days, provided patient with ectopic precautions and encouraged her to go to the [facility] for follow-up.  She expresses understanding and agreement with plan.  Discharged home in good condition.  Final Clinical Impression(s) / ED Diagnoses Final diagnoses:  Vaginal bleeding in pregnancy    Rx / DC Orders ED Discharge Orders    None       Dartha Lodge, [place] 04/15/19 1607    Pricilla Loveless, MD 04/16/19 [phone]

## 2019-04-15 NOTE — Discharge Instructions (Addendum)
Your hCG level is 632 today which is normal for a 2 to 3-week pregnancy.  Your ultrasound did not show an intrauterine pregnancy today, this could mean that it is too early to see pregnancy on ultrasound or that you have had a miscarriage, could also mean that you have a pregnancy that is ectopic (not in the right place).  Is very important that you follow-up in 2 days for repeat hCG.  If you start having heavier bleeding, or worsening pain you should go to the [facility] at [facility] immediately, otherwise follow-up there in 2 days.

## 2019-04-15 NOTE — ED Triage Notes (Signed)
Pt reports having a positive pregnancy test on Tuesday. Wed started having vaginal bleeding that has been light and heavy. Today having abd cramping and passed clot.

## 2019-04-16 LAB — GC/CHLAMYDIA PROBE AMP (~~LOC~~) NOT AT ARMC
Chlamydia: NEGATIVE
Comment: NEGATIVE
Comment: NORMAL
Neisseria Gonorrhea: NEGATIVE

## 2020-01-10 ENCOUNTER — Emergency Department (HOSPITAL_COMMUNITY)
Admission: EM | Admit: 2020-01-10 | Discharge: 2020-01-10 | Disposition: A | Discharge status: Home / Self Care | Payer: Medicaid Other | Attending: Emergency Medicine | Admitting: Emergency Medicine

## 2020-01-10 ENCOUNTER — Other Ambulatory Visit: Payer: Self-pay

## 2020-01-10 ENCOUNTER — Encounter (HOSPITAL_COMMUNITY): Payer: Self-pay | Admitting: Emergency Medicine

## 2020-01-10 DIAGNOSIS — U071 COVID-19: Secondary | ICD-10-CM | POA: Insufficient documentation

## 2020-01-10 DIAGNOSIS — F17210 Nicotine dependence, cigarettes, uncomplicated: Secondary | ICD-10-CM | POA: Insufficient documentation

## 2020-01-10 DIAGNOSIS — R509 Fever, unspecified: Secondary | ICD-10-CM | POA: Diagnosis present

## 2020-01-10 LAB — RESP PANEL BY RT-PCR (FLU A&B, COVID) ARPGX2
Influenza A by PCR: NEGATIVE
Influenza B by PCR: NEGATIVE
SARS Coronavirus 2 by RT PCR: POSITIVE — AB

## 2020-01-10 MED ORDER — IBUPROFEN 800 MG PO TABS: 800.0000 mg | ORAL_TABLET | Freq: Three times a day (TID) | ORAL | 0 refills | Status: DC | PRN

## 2020-01-10 MED ORDER — IBUPROFEN 800 MG PO TABS
800.0000 mg | ORAL_TABLET | Freq: Once | ORAL | Status: AC
  Administered 2020-01-10: 800 mg via ORAL

## 2020-01-10 NOTE — ED Triage Notes (Signed)
Patient here from home reporting fever, generalized body aches, headaches x2 days. Non-vaccinated.

## 2020-01-10 NOTE — ED Provider Notes (Signed)
TIME SEEN: 5:07 AM  CHIEF COMPLAINT: Fevers, body aches  HPI: Patient is a 29 year old female who presents to the emergency department with 2 days of fevers, body aches, sore throat.  She has not had her COVID-19 vaccines.  No chest pain.  No vomiting or diarrhea.  States she just needs something for pain to help her sleep.  ROS: See HPI Constitutional:  fever  Eyes: no drainage  ENT: no runny nose   Cardiovascular:  no chest pain  Resp: no SOB  GI: no vomiting GU: no dysuria Integumentary: no rash  Allergy: no hives  Musculoskeletal: no leg swelling  Neurological: no slurred speech ROS otherwise negative  PAST MEDICAL HISTORY/PAST SURGICAL HISTORY:  Past Medical History:  Diagnosis Date  . Heart murmur   . Ovarian cyst     MEDICATIONS:  Prior to Admission medications   Medication Sig Start Date End Date Taking? Authorizing Provider  naproxen (NAPROSYN) 500 MG tablet Take 1 tablet (500 mg total) by mouth 2 (two) times daily. Patient not taking: Reported on 04/15/2019 10/07/18   Horton, Mayer Masker, MD    ALLERGIES:  No Known Allergies  SOCIAL HISTORY:  Social History   Tobacco Use  . Smoking status: Current Every Day Smoker    Packs/day: 0.25    Types: Cigarettes  . Smokeless tobacco: Current User  Substance Use Topics  . Alcohol use: No    FAMILY HISTORY: No family history on file.  EXAM: BP 126/84 (BP Location: Left Arm)   Pulse 75   Temp (!) 100.9 F (38.3 C) (Oral)   Resp 16   LMP 02/11/2019   SpO2 100%  CONSTITUTIONAL: Alert and oriented and responds appropriately to questions. Well-appearing; well-nourished HEAD: Normocephalic EYES: Conjunctivae clear, pupils appear equal, EOM appear intact ENT: normal nose; moist mucous membranes NECK: Supple, normal ROM CARD: RRR; S1 and S2 appreciated; no murmurs, no clicks, no rubs, no gallops RESP: Normal chest excursion without splinting or tachypnea; breath sounds clear and equal bilaterally; no wheezes, no  rhonchi, no rales, no hypoxia or respiratory distress, speaking full sentences ABD/GI: Normal bowel sounds; non-distended; soft, non-tender, no rebound, no guarding, no peritoneal signs, no hepatosplenomegaly BACK:  The back appears normal EXT: Normal ROM in all joints; no deformity noted, no edema; no cyanosis SKIN: Normal color for age and race; warm; no rash on exposed skin NEURO: Moves all extremities equally, ambulates with steady gait PSYCH: The patient's mood and manner are appropriate.   MEDICAL DECISION MAKING: Patient here with COVID-19 symptoms since yesterday.  She is Covid positive here.  Extremely well-appearing.  No hypoxia or respiratory distress.  She is able to ambulate without any increased work of breathing.  Satting 100% on room air here.  Recommended alternating Tylenol and Motrin for fever, myalgias.  I do not feel she needs further emergent work-up.  Discussed importance of quarantine.  She is not a candidate for monoclonal antibodies.  At this time, I do not feel there is any life-threatening condition present. I have reviewed, interpreted and discussed all results (EKG, imaging, lab, urine as appropriate) and exam findings with patient/family. I have reviewed nursing notes and appropriate previous records.  I feel the patient is safe to be discharged home without further emergent workup and can continue workup as an outpatient as needed. Discussed usual and customary return precautions. Patient/family verbalize understanding and are comfortable with this plan.  Outpatient follow-up has been provided as needed. All questions have been answered.  Charlotta Lapaglia was evaluated in Emergency Department on 01/10/2020 for the symptoms described in the history of present illness. She was evaluated in the context of the global COVID-19 pandemic, which necessitated consideration that the patient might be at risk for infection with the SARS-CoV-2 virus that causes COVID-19.  Institutional protocols and algorithms that pertain to the evaluation of patients at risk for COVID-19 are in a state of rapid change based on information released by regulatory bodies including the CDC and federal and state organizations. These policies and algorithms were followed during the patient's care in the ED.      Joakim Huesman, Layla Maw, DO 01/10/20 [phone]

## 2020-01-10 NOTE — Discharge Instructions (Signed)
Please quarantine for 10 days after the onset of symptoms.  You will need to be fever free without using Tylenol or Ibuprofen for 3 full days AND your symptoms will need to be significantly improving or resolved (other than the loss of taste and smell which can last up to 3 months) before you can come out of quarantine. ° °You should isolate from others that you live with as well. ° °Any close contacts that have seen you in the past 2 days before your symptoms have started will need to be notified and they will need to quarantine for 14 full days even if they have a negative COVID test. ° °COVID-19 is a viral illness and currently there are no specific outpatient treatments other than supportive measures such as alternating Tylenol and Ibuprofen, rest, increased fluid intake.  You do not need antibiotics. ° °If you develop chest pain, difficulty breathing, have blue lips or fingertips, begin vomiting and can not stop and can not hold down fluids, feel like you may pass out or you do pass out, have confusion, please return to the emergency department. ° °You may alternate Tylenol 1000 mg every 6 hours as needed for pain, fever and Ibuprofen 800 mg every 8 hours as needed for pain, fever.  Please take Ibuprofen with food.  Do not take more than 4000 mg of Tylenol (acetaminophen) in a 24 hour period. ° ° °

## 2020-10-03 IMAGING — US US PELVIS COMPLETE TRANSABD/TRANSVAG W DUPLEX
1 series · 13 of 25 positions shown · non-contrast
Comparison: None.

CLINICAL DATA: 28-year-old female presents with 1 day of left
pelvic pain. LMP 10/20/2018.

EXAM:
TRANSABDOMINAL AND TRANSVAGINAL ULTRASOUND OF PELVIS
DOPPLER ULTRASOUND OF OVARIES
TECHNIQUE: Both transabdominal and transvaginal ultrasound examinations of the
pelvis were performed. Transabdominal technique was performed for
global imaging of the pelvis including uterus, ovaries, adnexal
regions, and pelvic cul-de-sac.
It was necessary to proceed with endovaginal exam following the
transabdominal exam to visualize the endometrium and adnexa. Color
and duplex Doppler ultrasound was utilized to evaluate blood flow to
the ovaries.

[Series 1: us pelvis complete transabd/transvag w duplex · 0.22mm/px · 13 of 154 slices shown]
[im 1/154]
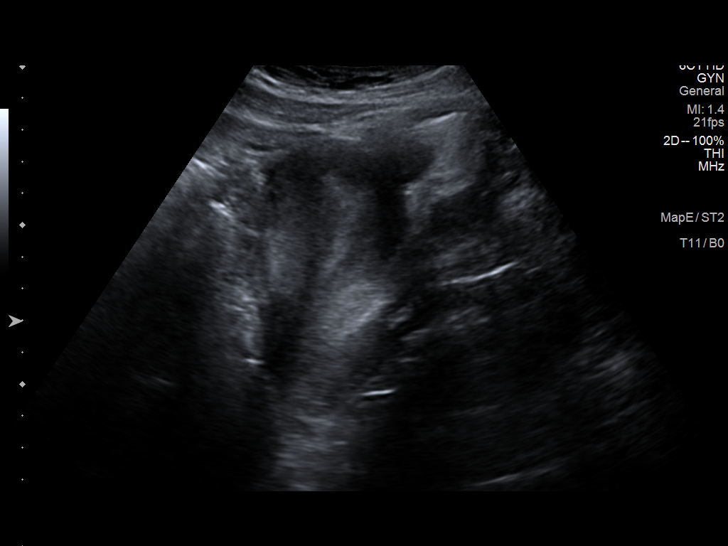
[im 13/154]
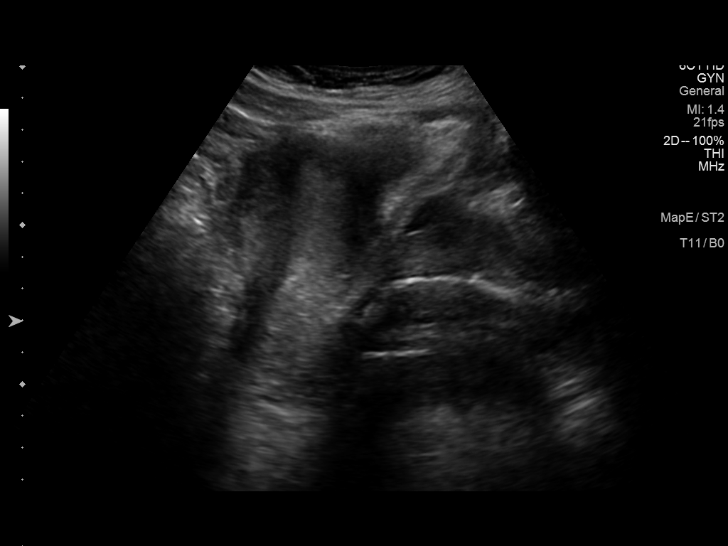
[im 26/154]
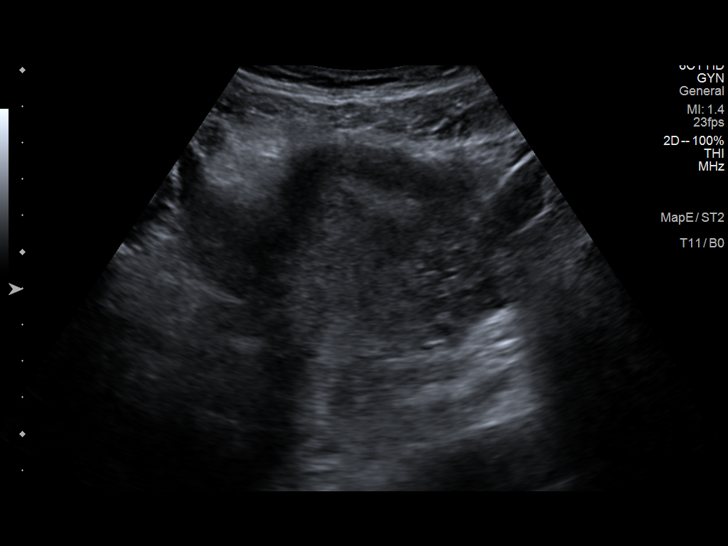
[im 39/154]
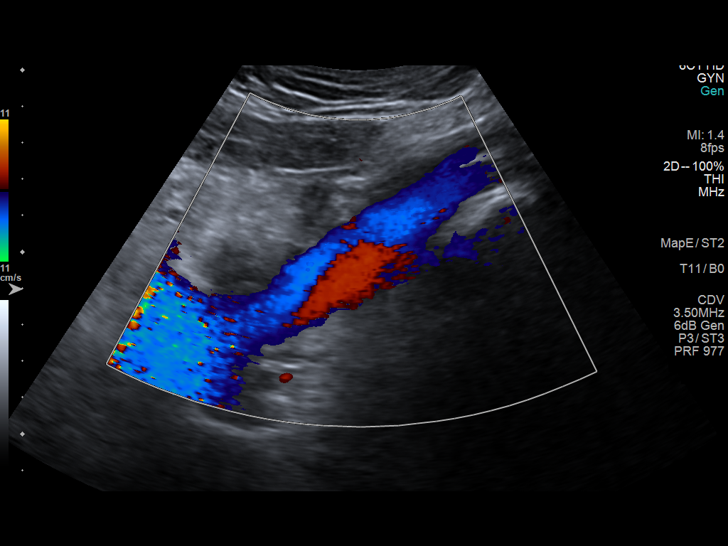
[im 52/154]
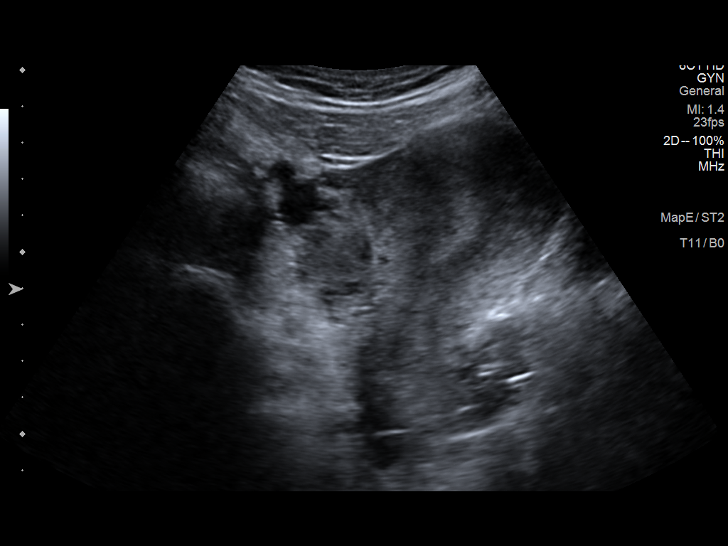
[im 64/154]
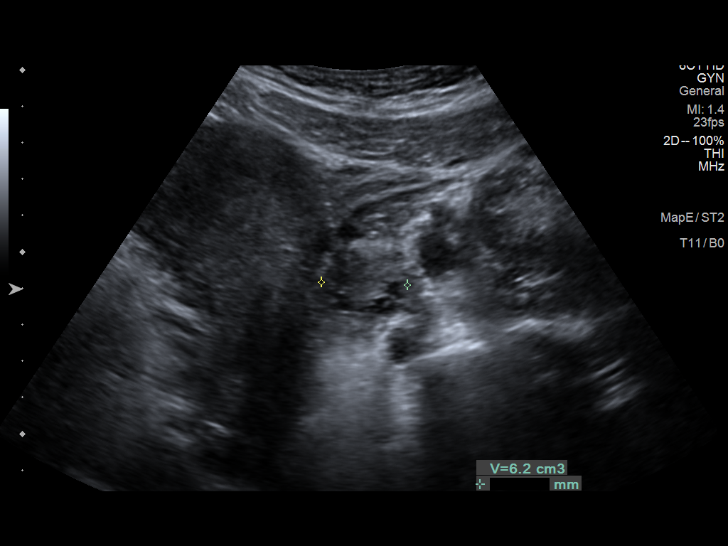
[im 77/154]
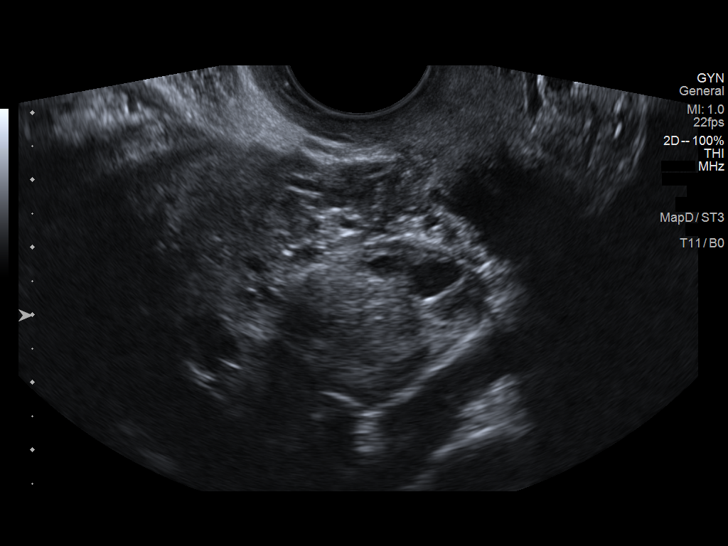
[im 90/154]
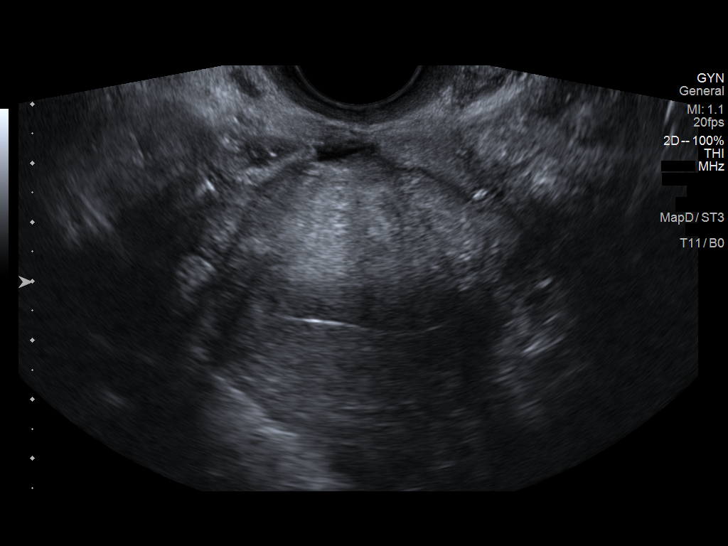
[im 103/154]
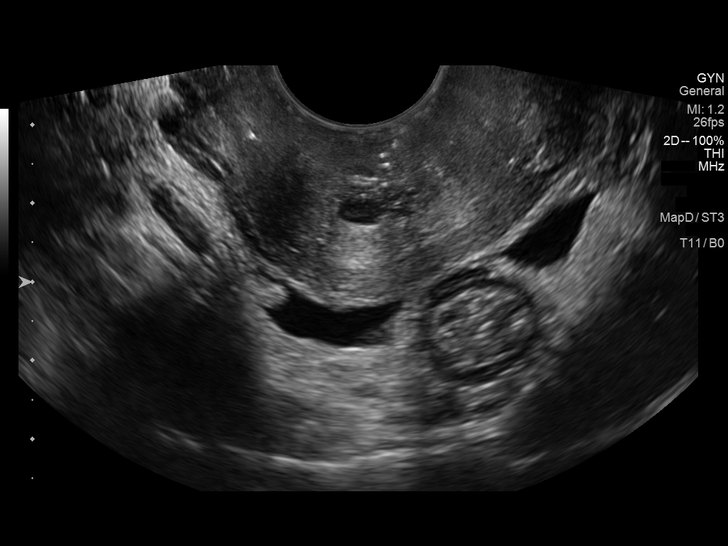
[im 115/154]
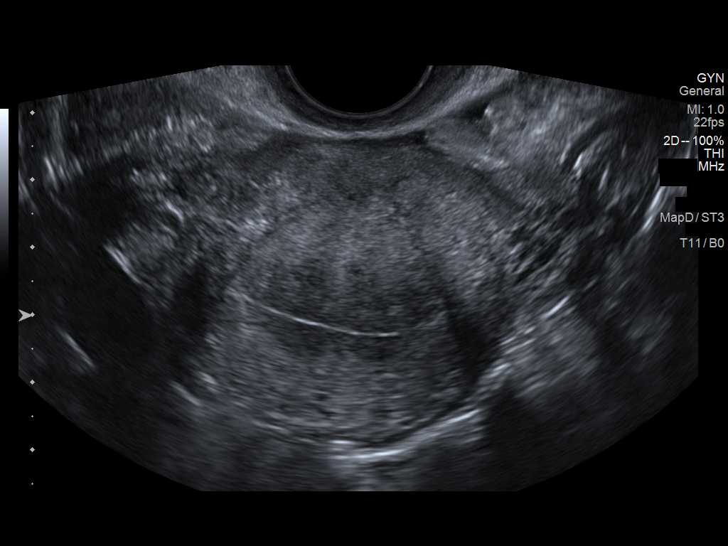
[im 128/154]
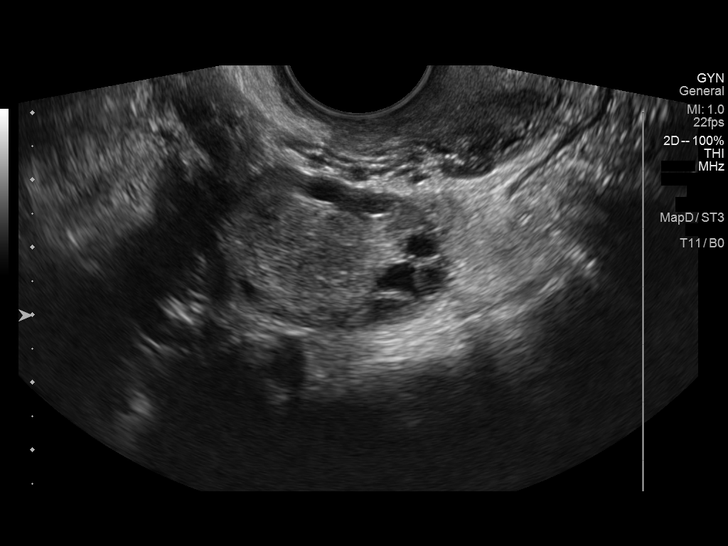
[im 141/154]
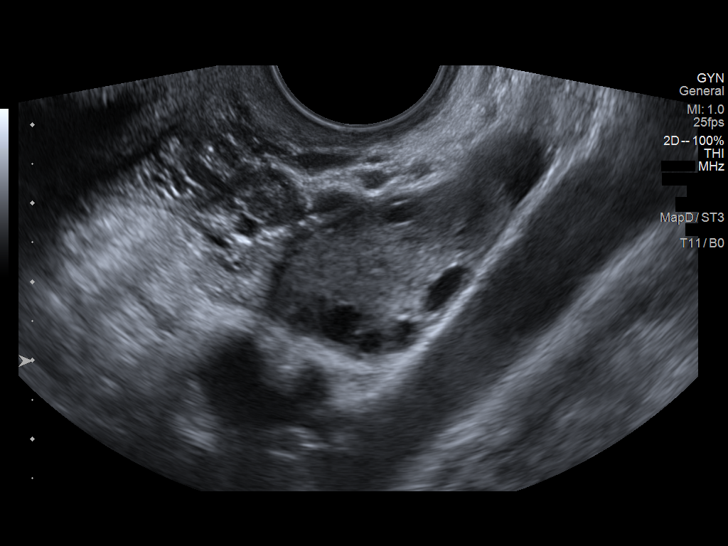
[im 154/154]
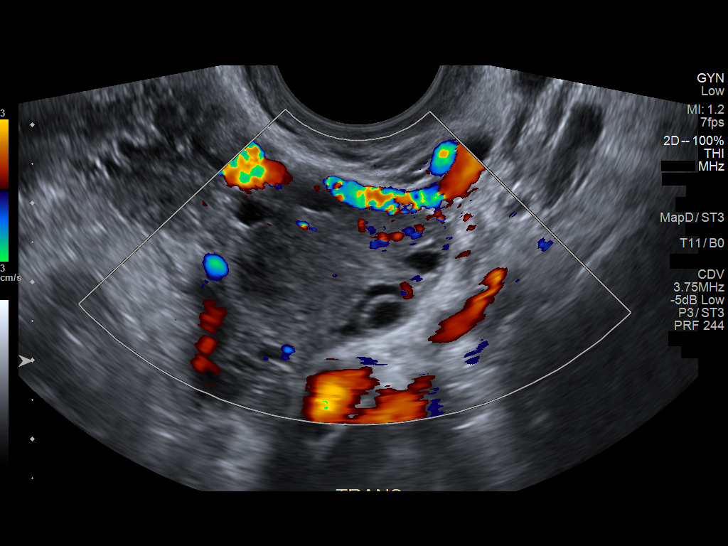

[13 of 25 positions shown; findings below may reference images not displayed]

FINDINGS: Uterus

Measurements: 8.9 x 4.7 x 5.3 cm = volume: 118 mL. Anteverted uterus
is normal in size and configuration, with no uterine fibroids or
other myometrial abnormalities.

Endometrium

Thickness: 1 mm. No endometrial cavity fluid or focal endometrial
mass.

Right ovary

Measurements: 3.5 x 1.9 x 2.2 cm = volume: 7.7 mL. Normal
appearance/no adnexal mass.

Left ovary

Measurements: 3.5 x 1.8 x 2.8 cm = volume: 9.1 mL. Normal
appearance/no adnexal mass.

Pulsed Doppler evaluation of both ovaries demonstrates normal
low-resistance arterial and venous waveforms.

Other findings

No abnormal free fluid.
IMPRESSION: 1. Normal ovaries. No evidence of adnexal torsion. No adnexal
masses.
2. Normal anteverted uterus.

## 2021-03-13 IMAGING — US US OB COMP LESS 14 WK
1 series · 14 of 28 positions shown · non-contrast
Comparison: 11/05/2018

CLINICAL DATA: Vaginal bleeding, lower abdominal cramping, early
pregnancy

EXAM:
TRANSVAGINAL OB ULTRASOUND; OBSTETRIC <14 WK ULTRASOUND
TECHNIQUE: Transvaginal ultrasound was performed for complete evaluation of the
gestation as well as the maternal uterus, adnexal regions, and
pelvic cul-de-sac.

[Series 1: us ob comp less 14 wk · 114 acquisitions, 14 frames shown]
[im 5/114]
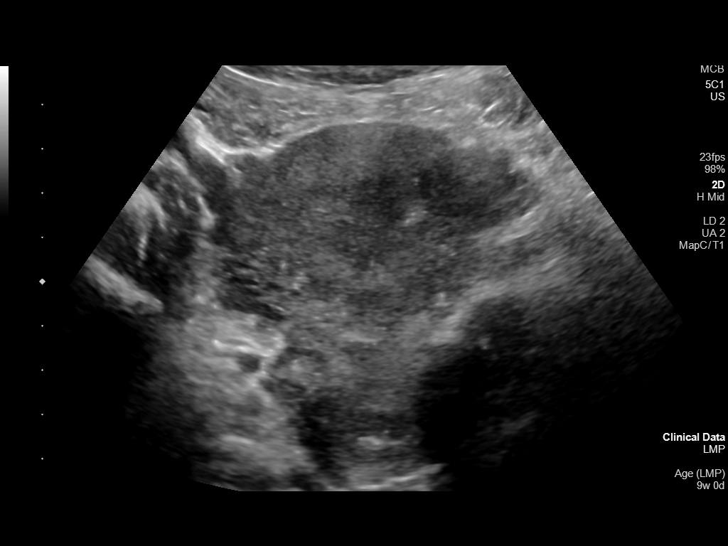
[im 13/114]
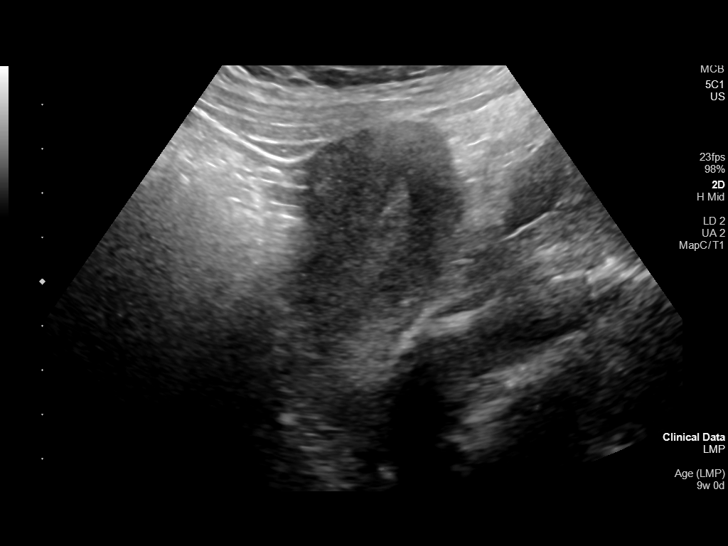
[im 21/114]
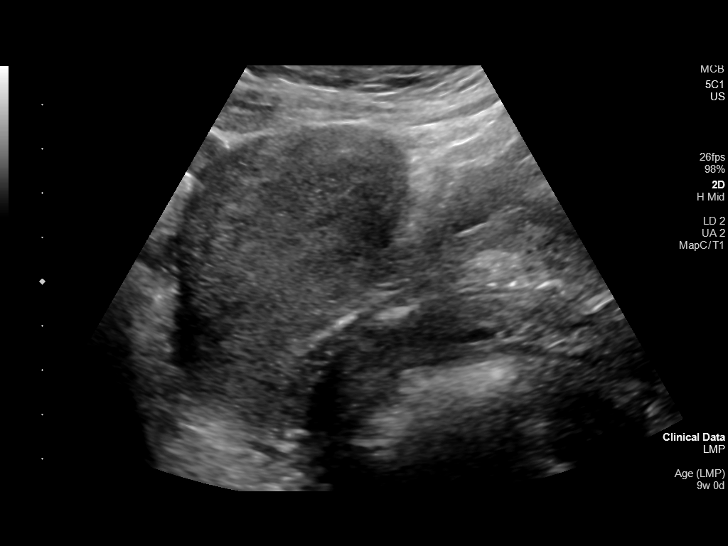
[im 30/114]
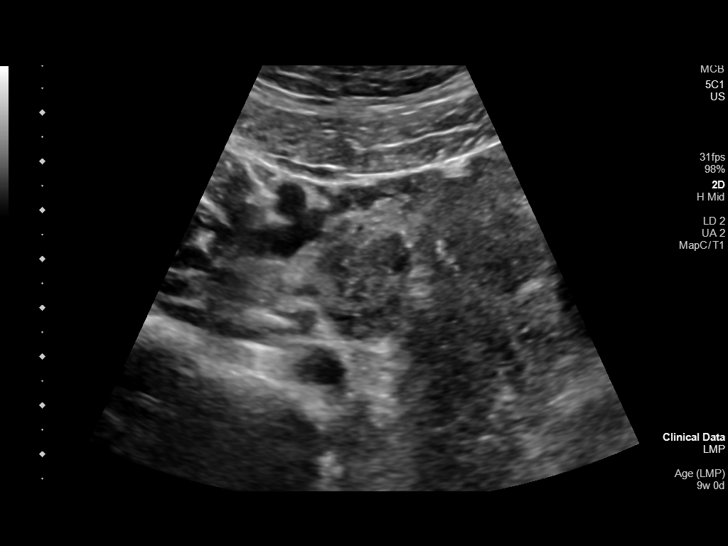
[im 38/114]
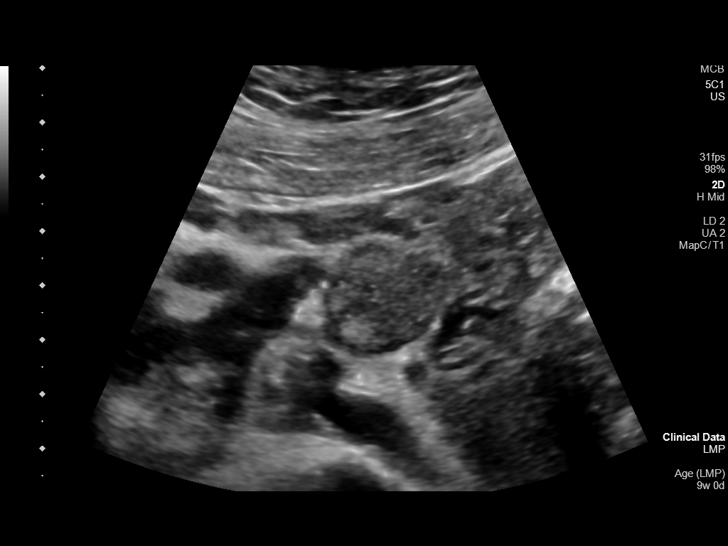
[im 47/114]
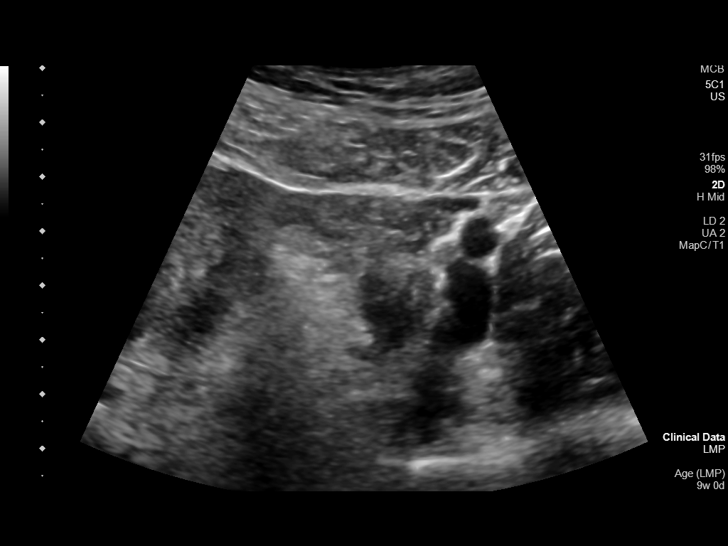
[im 55/114]
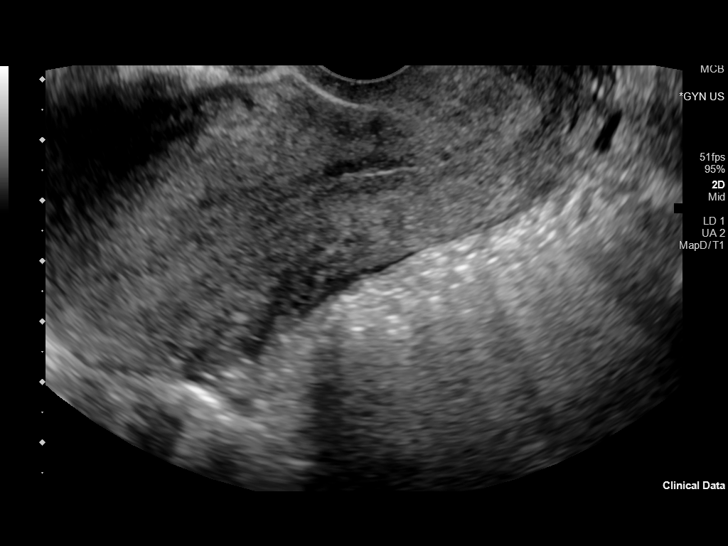
[im 63/114]
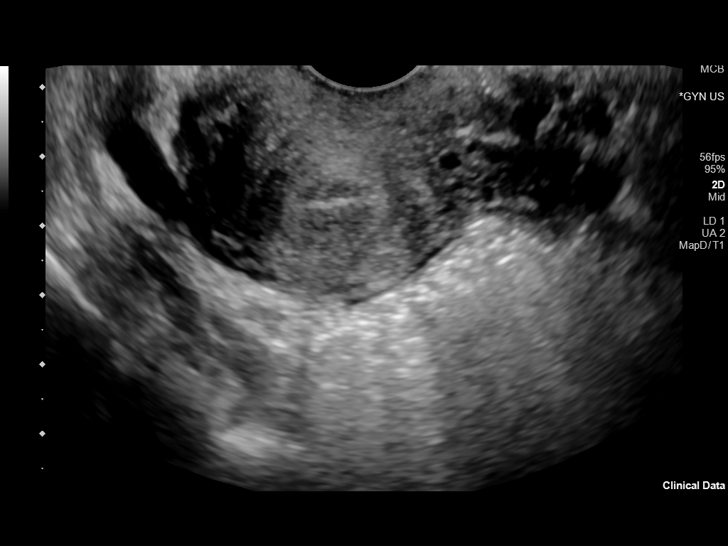
[im 72/114]
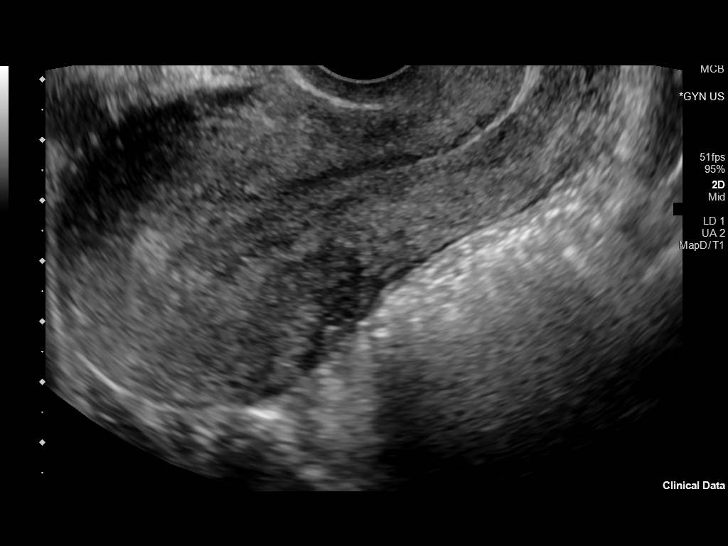
[im 80/114]
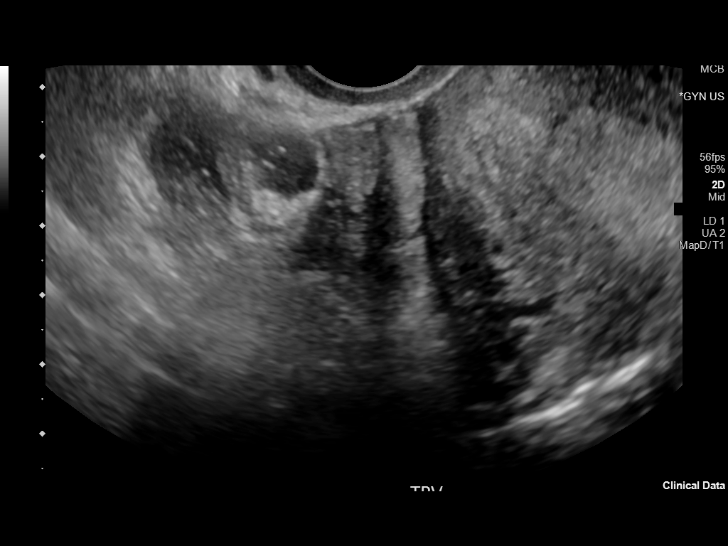
[im 88/114]
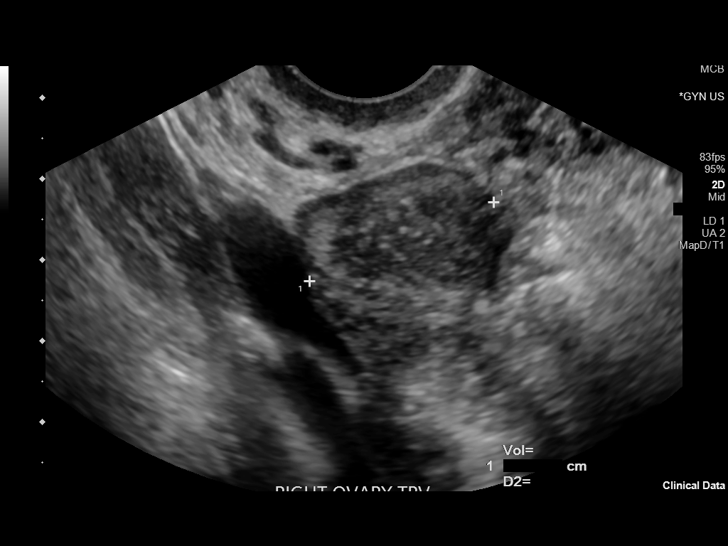
[im 97/114]
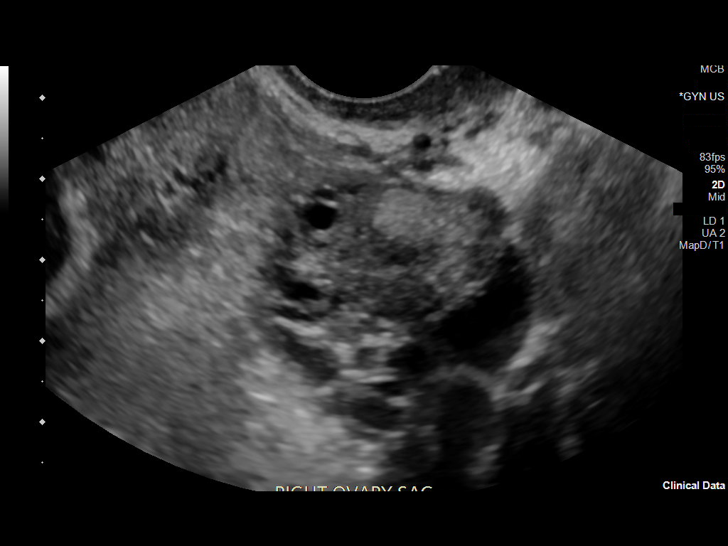
[im 105/114]
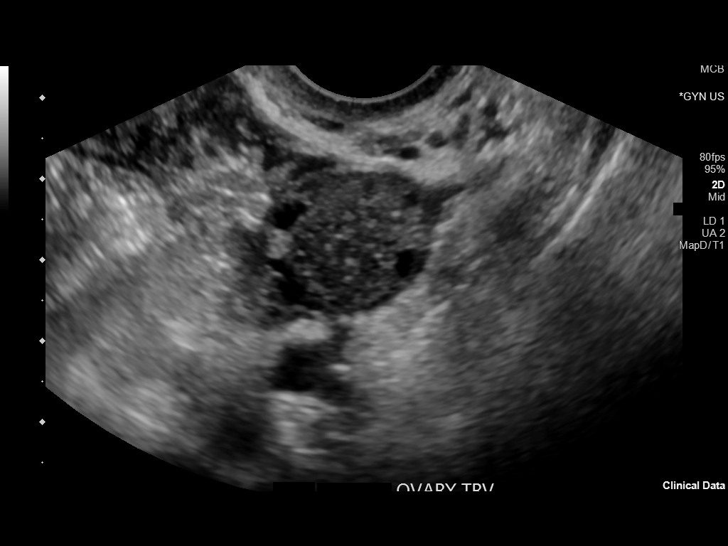
[im 114/114]
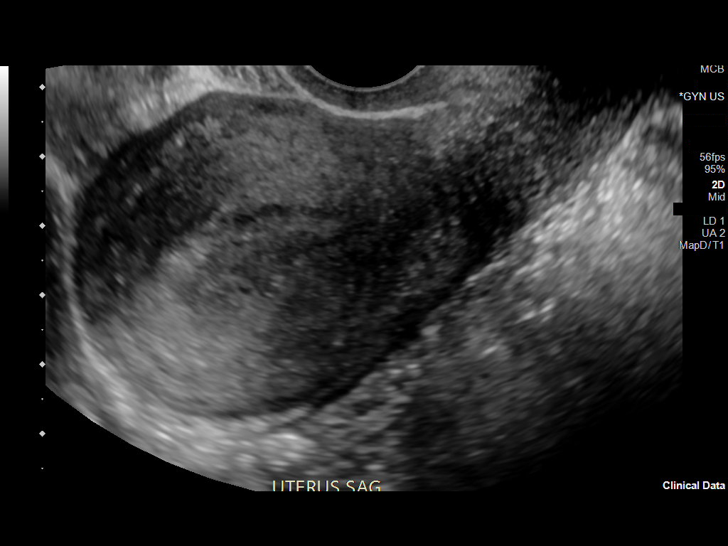

[14 of 28 positions shown; findings below may reference images not displayed]

FINDINGS: Intrauterine gestational sac: None

Yolk sac:  Not Visualized.

Embryo:  Not Visualized.

Cardiac Activity: Not Visualized.

Heart Rate: Not applicable.

Subchorionic hemorrhage:  None visualized.

Maternal uterus/adnexae: Normal in appearance. Endometrial thickness
8 mm.
IMPRESSION: 1. No candidate intrauterine gestation identified. Early pregnancy
of unknown location. Recommend serial beta HCG, follow-up
ultrasound, and ectopic precautions.

2. No adnexal mass or other findings suspicious for ectopic
pregnancy.

## 2021-08-02 ENCOUNTER — Ambulatory Visit (LOCAL_COMMUNITY_HEALTH_CENTER): Payer: Medicaid Other

## 2021-08-02 VITALS — BP 144/96 | Ht 66.0 in | Wt 182.0 lb

## 2021-08-02 DIAGNOSIS — Z3201 Encounter for pregnancy test, result positive: Secondary | ICD-10-CM | POA: Diagnosis not present

## 2021-08-02 LAB — PREGNANCY, URINE: Preg Test, Ur: POSITIVE — AB

## 2021-08-02 MED ORDER — PRENATAL VITAMINS 28-0.8 MG PO TABS: 28.0000 mg | ORAL_TABLET | Freq: Every day | ORAL | 0 refills | Status: AC

## 2021-08-02 NOTE — Progress Notes (Addendum)
UPT positive.  States LMP 05/03/21 but not normal; LNMP 04/04/21.  Thinks she feels little flutters and says she seems to be showing already.  See pregnancy flow sheet.   Bp 144/96.  States no hx Bp problem but has family hx.  Bp recheck at conclusion of visit and 134/90.  Pt states she smoked before visit.   Desires prenatal care at [facility].  Consulted E. Sciora CNM regarding elevated BP and pt instructed to contact [facility], [facility], or [facility] for prenatal care. Prenatal resource list given.  Encouraged pt to seek prenatal care soon.   Pregnancy package given to pt and reviewed.  Smoking and pregnancy pamphlet given.      Cherlynn Polo, RN  Consulted on the plan of care for this client.  I agree with the documented note and actions taken to provide care for this client.  Hazle Coca, CNM

## 2021-08-20 ENCOUNTER — Telehealth: Payer: Self-pay

## 2021-08-20 NOTE — Telephone Encounter (Signed)
Telephone call to patient today regarding her + PT results on 08-02-21.  Inquired about her plans for pregnancy and the fact she has increased blood pressure and Arnetha Courser, CNM advised her call a private office for prenatal care due to being high risk, had she done so.  Patient reports not yet.  I explained to her the importance for doing that sooner rather than later.  She reports wanting her prenatal care at [facility].  She plans to call soon for an appointment.  Hart Carwin, RN

## 2021-10-06 ENCOUNTER — Encounter: Payer: Medicaid Other | Admitting: Obstetrics

## 2021-10-12 ENCOUNTER — Other Ambulatory Visit: Payer: Medicaid Other

## 2021-10-12 ENCOUNTER — Other Ambulatory Visit: Payer: Self-pay | Admitting: Certified Nurse Midwife

## 2021-10-12 ENCOUNTER — Other Ambulatory Visit: Payer: Self-pay

## 2021-10-12 ENCOUNTER — Ambulatory Visit (INDEPENDENT_AMBULATORY_CARE_PROVIDER_SITE_OTHER): Payer: Medicaid Other

## 2021-10-12 DIAGNOSIS — Z3687 Encounter for antenatal screening for uncertain dates: Secondary | ICD-10-CM

## 2021-10-12 DIAGNOSIS — O0930 Supervision of pregnancy with insufficient antenatal care, unspecified trimester: Secondary | ICD-10-CM

## 2021-10-12 DIAGNOSIS — Z369 Encounter for antenatal screening, unspecified: Secondary | ICD-10-CM

## 2021-10-12 DIAGNOSIS — Z3401 Encounter for supervision of normal first pregnancy, first trimester: Secondary | ICD-10-CM

## 2021-10-12 DIAGNOSIS — Z3689 Encounter for other specified antenatal screening: Secondary | ICD-10-CM

## 2021-10-12 DIAGNOSIS — O0990 Supervision of high risk pregnancy, unspecified, unspecified trimester: Secondary | ICD-10-CM | POA: Insufficient documentation

## 2021-10-12 DIAGNOSIS — Z3480 Encounter for supervision of other normal pregnancy, unspecified trimester: Secondary | ICD-10-CM

## 2021-10-12 NOTE — Progress Notes (Signed)
New OB Intake  I connected with  Brianna Jarvis on 10/12/21 at  1:15 PM EDT by telephone and verified that I am speaking with the correct person using two identifiers. Nurse is located at [employer] and pt is located at home.  I explained I am completing New OB Intake today. We discussed her EDD of 02/07/2022 that is based on LMP of 05/03/2021. Pt is G6/P3023. Pt is late to care. I reviewed her allergies, medications, Medical/Surgical/OB history, and appropriate screenings. Based on history, this is a/an pregnancy uncomplicated .   There are no problems to display for this patient.   Concerns addressed today Interested in having a water birth.  Delivery Plans:  Plans to deliver at [facility]  Would like to deliver at [facility] again.  Adv if she did deliver at [facility] she would have someone deliver her that she hasn't met before; our providers do not deliver there.  Pt states she will deliver at [facility].  Anatomy US  Anatomy US scheduled for tomorrow at 4pm. Pt notified to arrive at 3:45.  Labs Discussed genetic screening with patient. Patient unsure about genetic testing. Discussed possible labs to be drawn at new OB appointment. NOB labs drawn earlier today.  COVID Vaccine Patient has not had COVID vaccine.   Social Determinants of Health Food Insecurity: expresses food insecurity. Information given on local food banks. WIC Referral: Patient is interested in referral to [facility]. Adv to contact Case Worker. Transportation: Patient denies transportation needs. Childcare: Discussed no children allowed at ultrasound appointments.   First visit review I reviewed new OB appt with pt. I explained she will have ob bloodwork and pap smear/pelvic exam if indicated. Explained pt will be seen by Lloyd Huger, CNM at first visit; encounter routed to appropriate provider.   Cleophas Dunker, [place] 10/12/2021  1:47 PM

## 2021-10-13 ENCOUNTER — Other Ambulatory Visit: Payer: Self-pay | Admitting: Obstetrics

## 2021-10-13 ENCOUNTER — Ambulatory Visit (INDEPENDENT_AMBULATORY_CARE_PROVIDER_SITE_OTHER): Payer: Medicaid Other

## 2021-10-13 ENCOUNTER — Encounter: Payer: Self-pay | Admitting: Obstetrics

## 2021-10-13 DIAGNOSIS — Z3480 Encounter for supervision of other normal pregnancy, unspecified trimester: Secondary | ICD-10-CM

## 2021-10-13 DIAGNOSIS — O0932 Supervision of pregnancy with insufficient antenatal care, second trimester: Secondary | ICD-10-CM | POA: Diagnosis not present

## 2021-10-13 DIAGNOSIS — Z3687 Encounter for antenatal screening for uncertain dates: Secondary | ICD-10-CM

## 2021-10-13 DIAGNOSIS — O0930 Supervision of pregnancy with insufficient antenatal care, unspecified trimester: Secondary | ICD-10-CM

## 2021-10-13 DIAGNOSIS — Z3A21 21 weeks gestation of pregnancy: Secondary | ICD-10-CM | POA: Diagnosis not present

## 2021-10-13 LAB — CBC/D/PLT+RPR+RH+ABO+RUBIGG...
Antibody Screen: NEGATIVE
Basophils Absolute: 0 10*3/uL (ref 0.0–0.2)
Basos: 0 %
EOS (ABSOLUTE): 0.2 10*3/uL (ref 0.0–0.4)
Eos: 2 %
HCV Ab: NONREACTIVE
HIV Screen 4th Generation wRfx: NONREACTIVE
Hematocrit: 30.5 % — ABNORMAL LOW (ref 34.0–46.6)
Hemoglobin: 9.2 g/dL — ABNORMAL LOW (ref 11.1–15.9)
Hepatitis B Surface Ag: NEGATIVE
Immature Grans (Abs): 0.1 10*3/uL (ref 0.0–0.1)
Immature Granulocytes: 1 %
Lymphocytes Absolute: 2 10*3/uL (ref 0.7–3.1)
Lymphs: 26 %
MCH: 21.5 pg — ABNORMAL LOW (ref 26.6–33.0)
MCHC: 30.2 g/dL — ABNORMAL LOW (ref 31.5–35.7)
MCV: 71 fL — ABNORMAL LOW (ref 79–97)
Monocytes Absolute: 0.6 10*3/uL (ref 0.1–0.9)
Monocytes: 8 %
Neutrophils Absolute: 4.9 10*3/uL (ref 1.4–7.0)
Neutrophils: 63 %
Platelets: 184 10*3/uL (ref 150–450)
RBC: 4.28 x10E6/uL (ref 3.77–5.28)
RDW: 15.8 % — ABNORMAL HIGH (ref 11.7–15.4)
RPR Ser Ql: NONREACTIVE
Rh Factor: POSITIVE
Rubella Antibodies, IGG: 3.7 index (ref >0.99)
Varicella zoster IgG: >4000 index (ref >165)
WBC: 7.7 10*3/uL (ref 3.4–10.8)

## 2021-10-13 LAB — HCV INTERPRETATION

## 2021-10-13 MED ORDER — FUSION PLUS PO CAPS: 1.0000 | ORAL_CAPSULE | Freq: Every day | ORAL | 4 refills | Status: DC

## 2021-10-19 ENCOUNTER — Encounter: Payer: Medicaid Other | Admitting: Obstetrics

## 2021-10-22 ENCOUNTER — Other Ambulatory Visit (HOSPITAL_COMMUNITY)
Admission: RE | Admit: 2021-10-22 | Discharge: 2021-10-22 | Disposition: A | Discharge status: Home / Self Care | Payer: Medicaid Other | Source: Ambulatory Visit | Attending: Obstetrics | Admitting: Obstetrics

## 2021-10-22 ENCOUNTER — Encounter: Payer: Self-pay | Admitting: Certified Nurse Midwife

## 2021-10-22 ENCOUNTER — Ambulatory Visit (INDEPENDENT_AMBULATORY_CARE_PROVIDER_SITE_OTHER): Payer: Medicaid Other | Admitting: Certified Nurse Midwife

## 2021-10-22 VITALS — BP 108/72 | HR 73 | Wt 187.3 lb

## 2021-10-22 DIAGNOSIS — Z3A24 24 weeks gestation of pregnancy: Secondary | ICD-10-CM | POA: Diagnosis not present

## 2021-10-22 DIAGNOSIS — Z124 Encounter for screening for malignant neoplasm of cervix: Secondary | ICD-10-CM | POA: Diagnosis present

## 2021-10-22 DIAGNOSIS — Z0283 Encounter for blood-alcohol and blood-drug test: Secondary | ICD-10-CM

## 2021-10-22 DIAGNOSIS — Z1379 Encounter for other screening for genetic and chromosomal anomalies: Secondary | ICD-10-CM

## 2021-10-22 DIAGNOSIS — Z3482 Encounter for supervision of other normal pregnancy, second trimester: Secondary | ICD-10-CM | POA: Diagnosis not present

## 2021-10-22 DIAGNOSIS — D72829 Elevated white blood cell count, unspecified: Secondary | ICD-10-CM | POA: Diagnosis not present

## 2021-10-22 DIAGNOSIS — R82998 Other abnormal findings in urine: Secondary | ICD-10-CM | POA: Diagnosis not present

## 2021-10-22 LAB — POCT URINALYSIS DIPSTICK OB
Blood, UA: NEGATIVE
Glucose, UA: NEGATIVE
Nitrite, UA: NEGATIVE
Spec Grav, UA: >=1.03 — AB (ref 1.010–1.025)
Urobilinogen, UA: 0.2 E.U./dL
pH, UA: 5 (ref 5.0–8.0)

## 2021-10-22 MED ORDER — ONDANSETRON HCL 4 MG PO TABS: 4.0000 mg | ORAL_TABLET | Freq: Three times a day (TID) | ORAL | 0 refills | Status: DC | PRN

## 2021-10-22 NOTE — Patient Instructions (Signed)
Oral Glucose Tolerance Test During Pregnancy Why am I having this test? The oral glucose tolerance test (OGTT) is done to check how your body processes blood sugar (glucose). This is one of several tests used to diagnose diabetes that develops during pregnancy (gestational diabetes mellitus). Gestational diabetes is a short-term form of diabetes that some women develop while they are pregnant. It usually occurs during the second trimester of pregnancy and goes away after delivery. Testing, or screening, for gestational diabetes usually occurs at weeks 24-28 of pregnancy. You may have the OGTT test after having a 1-hour glucose screening test if the results from that test indicate that you may have gestational diabetes. This test may also be needed if: You have a history of gestational diabetes. There is a history of giving birth to very large babies or of losing pregnancies (having stillbirths). You have signs and symptoms of diabetes, such as: Changes in your eyesight. Tingling or numbness in your hands or feet. Changes in hunger, thirst, and urination, and these are not explained by your pregnancy. What is being tested? This test measures the amount of glucose in your blood at different times during a period of 3 hours. This shows how well your body can process glucose. What kind of sample is taken?  Blood samples are required for this test. They are usually collected by inserting a needle into a blood vessel. How do I prepare for this test? For 3 days before your test, eat normally. Have plenty of carbohydrate-rich foods. Follow instructions from your health care provider about: Eating or drinking restrictions on the day of the test. You may be asked not to eat or drink anything other than water (to fast) starting 8-10 hours before the test. Changing or stopping your regular medicines. Some medicines may interfere with this test. Tell a health care provider about: All medicines you are  taking, including vitamins, herbs, eye drops, creams, and over-the-counter medicines. Any blood disorders you have. Any surgeries you have had. Any medical conditions you have. What happens during the test? First, your blood glucose will be measured. This is referred to as your fasting blood glucose because you fasted before the test. Then, you will drink a glucose solution that contains a certain amount of glucose. Your blood glucose will be measured again 1, 2, and 3 hours after you drink the solution. This test takes about 3 hours to complete. You will need to stay at the testing location during this time. During the testing period: Do not eat or drink anything other than the glucose solution. Do not exercise. Do not use any products that contain nicotine or tobacco, such as cigarettes, e-cigarettes, and chewing tobacco. These can affect your test results. If you need help quitting, ask your health care provider. The testing procedure may vary among health care providers and hospitals. How are the results reported? Your results will be reported as milligrams of glucose per deciliter of blood (mg/dL) or millimoles per liter (mmol/L). There is more than one source for screening and diagnosis reference values used to diagnose gestational diabetes. Your health care provider will compare your results to normal values that were established after testing a large group of people (reference values). Reference values may vary among labs and hospitals. For this test (Carpenter-Coustan), reference values are: Fasting: 95 mg/dL (5.3 mmol/L). 1 hour: 180 mg/dL (10.0 mmol/L). 2 hour: 155 mg/dL (8.6 mmol/L). 3 hour: 140 mg/dL (7.8 mmol/L). What do the results mean? Results below the reference values are   considered normal. If two or more of your blood glucose levels are at or above the reference values, you may be diagnosed with gestational diabetes. If only one level is high, your health care provider may  suggest repeat testing or other tests to confirm a diagnosis. Talk with your health care provider about what your results mean. Questions to ask your health care provider Ask your health care provider, or the department that is doing the test: When will my results be ready? How will I get my results? What are my treatment options? What other tests do I need? What are my next steps? Summary The oral glucose tolerance test (OGTT) is one of several tests used to diagnose diabetes that develops during pregnancy (gestational diabetes mellitus). Gestational diabetes is a short-term form of diabetes that some women develop while they are pregnant. You may have the OGTT test after having a 1-hour glucose screening test if the results from that test show that you may have gestational diabetes. You may also have this test if you have any symptoms or risk factors for this type of diabetes. Talk with your health care provider about what your results mean. This information is not intended to replace advice given to you by your health care provider. Make sure you discuss any questions you have with your health care provider. Document Revised: 06/06/2019 Document Reviewed: 06/06/2019 Elsevier Patient Education  2023 [employer]  

## 2021-10-22 NOTE — Progress Notes (Signed)
NEW OB HISTORY AND PHYSICAL  SUBJECTIVE:       Brianna Jarvis is a 31 y.o. [phone] female, Patient's last menstrual period was 05/03/2021 (approximate)., Estimated Date of Delivery: 02/18/2022 23 weeks presents today for establishment of Prenatal Care. She hd complains of nausea , that she has been using Maijuna to treat.  Social Relationship: engaged Living with partner and children [employer]  Exercise: none Smokes: 3-4 cig a day  Alcohol: denies Drugs MJ use      Gynecologic History Patient's last menstrual period was 05/03/2021 (approximate). Normal Contraception: none Last Pap: unsure .   Obstetric History OB History  Gravida Para Term Preterm AB Living  6 3 3  0 2 3  SAB IAB Ectopic Multiple Live Births  2 0 0 0 3    # Outcome Date GA Lbr Len/2nd Weight Sex Delivery Anes PTL Lv  6 Current           5 SAB 2022          4 SAB 2021          3 Term 03/02/16 [redacted]w[redacted]d  3.005 kg M   N LIV  2 Term 12/08/11 [redacted]w[redacted]d  3.005 kg F Vag-Spont  N LIV  1 Term 10/05/05 [redacted]w[redacted]d  3.005 kg M   N LIV    Past Medical History:  Diagnosis Date   Heart murmur    Ovarian cyst     Past Surgical History:  Procedure Laterality Date   WISDOM TOOTH EXTRACTION     four; 2017    Current Outpatient Medications on File Prior to Visit  Medication Sig Dispense Refill   Prenatal Vit-Fe Fumarate-FA (PRENATAL VITAMINS) 28-0.8 MG TABS Take 28 mg by mouth daily. 100 tablet 0   Iron-FA-B Cmp-C-Biot-Probiotic (FUSION PLUS) CAPS Take 1 tablet by mouth daily. (Patient not taking: Reported on 10/22/2021) 30 capsule 4   No current facility-administered medications on file prior to visit.    No Known Allergies  Social History   Socioeconomic History   Marital status: Significant Other    Spouse name: Not on file   Number of children: 3   Years of education: 70   Highest education level: GED or equivalent  Occupational History   Occupation: Building surveyor service  Tobacco Use   Smoking  status: Every Day    Packs/day: 0.25    Types: Cigarettes    Passive exposure: Current (FOB smokes)   Smokeless tobacco: Never  Vaping Use   Vaping Use: Never used  Substance and Sexual Activity   Alcohol use: Not Currently    Comment: red wine 2 weeks ago   Drug use: Not Currently    Types: Marijuana    Comment: occasional; last use 2 weeks ago   Sexual activity: Yes    Partners: Male    Birth control/protection: None    Comment: hx depo and BCP  Other Topics Concern   Not on file  Social History Narrative   Not on file   Social Determinants of Health   Financial Resource Strain: Low Risk  (10/12/2021)   Overall Financial Resource Strain (CARDIA)    Difficulty of Paying Living Expenses: Not very hard  Food Insecurity: Food Insecurity Present (10/12/2021)   Hunger Vital Sign    Worried About Running Out of Food in the Last Year: Sometimes true    Ran Out of Food in the Last Year: Sometimes true  Transportation Needs: No Transportation Needs (10/12/2021)   [place] - Transportation  Lack of Transportation (Medical): No    Lack of Transportation (Non-Medical): No  Physical Activity: Inactive (10/12/2021)   Exercise Vital Sign    Days of Exercise per Week: 0 days    Minutes of Exercise per Session: 0 min  Stress: No Stress Concern Present (10/12/2021)   [employer] of Occupational Health - Occupational Stress Questionnaire    Feeling of Stress : Not at all  Social Connections: Unknown (10/12/2021)   Social Connection and Isolation Panel [NHANES]    Frequency of Communication with Friends and Family: Not on file    Frequency of Social Gatherings with Friends and Family: Not on file    Attends Religious Services: Never    Active Member of Clubs or Organizations: No    Attends Banker Meetings: Never    Marital Status: Living with partner  Intimate Partner Violence: Not At Risk (10/12/2021)   Humiliation, Afraid, Jarvis, and Kick questionnaire    Fear of  Current or Ex-Partner: No    Emotionally Abused: No    Physically Abused: No    Sexually Abused: No    Family History  Problem Relation Age of Onset   Diabetes Mother    Diabetes Father    Cancer Father 4       thyroid   Kidney failure Brother        as a baby   Stroke Maternal Grandmother    Diabetes Maternal Grandmother    Dementia Paternal Grandfather     The following portions of the patient's history were reviewed and updated as appropriate: allergies, current medications, past OB history, past medical history, past surgical history, past family history, past social history, and problem list.    OBJECTIVE: Initial Physical Exam (New OB)  GENERAL APPEARANCE: alert, well appearing, in no apparent distress, oriented to person, place and time HEAD: normocephalic, atraumatic MOUTH: mucous membranes moist, pharynx normal without lesions THYROID: no thyromegaly or masses present BREASTS: no masses noted, no significant tenderness, no palpable axillary nodes, no skin changes LUNGS: clear to auscultation, no wheezes, rales or rhonchi, symmetric air entry HEART: regular rate and rhythm, no murmurs ABDOMEN: soft, nontender, nondistended, no abnormal masses, no epigastric pain and FHT present EXTREMITIES: no redness or tenderness in the calves or thighs SKIN: normal coloration and turgor, no rashes LYMPH NODES: no adenopathy palpable NEUROLOGIC: alert, oriented, normal speech, no focal findings or movement disorder noted  PELVIC EXAM EXTERNAL GENITALIA: normal appearing vulva with no masses, tenderness or lesions VAGINA: no abnormal discharge or lesions, pap collected CERVIX: no lesions or cervical motion tenderness UTERUS: gravid ADNEXA: no masses palpable and nontender OB EXAM PELVIMETRY: appears adequate RECTUM: exam not indicated  ASSESSMENT: Normal pregnancy  PLAN: Prenatal care See orders New OB counseling: The patient has been given an overview regarding routine  prenatal care. Recommendations regarding diet, weight gain, and exercise in pregnancy were given. Prenatal testing, optional genetic testing, carrier screening and ultrasound use in pregnancy were reviewed. Maternit 21 today.  Encouraged cessation of smoking and drugs. Zofran orderd for nausea. Benefits of Breast Feeding were discussed. The patient is encouraged to consider nursing her baby post partum.   Doreene Burke, CNM

## 2021-10-25 LAB — URINE CULTURE

## 2021-10-26 ENCOUNTER — Encounter: Payer: Self-pay | Admitting: Certified Nurse Midwife

## 2021-10-26 ENCOUNTER — Other Ambulatory Visit: Payer: Self-pay | Admitting: Certified Nurse Midwife

## 2021-10-26 LAB — CYTOLOGY - PAP
Comment: NEGATIVE
Diagnosis: NEGATIVE
High risk HPV: NEGATIVE

## 2021-10-26 MED ORDER — METRONIDAZOLE 500 MG PO TABS: 500.0000 mg | ORAL_TABLET | Freq: Two times a day (BID) | ORAL | 0 refills | Status: AC

## 2021-10-27 LAB — MATERNIT 21 PLUS CORE, BLOOD
Fetal Fraction: 21
Result (T21): NEGATIVE
Trisomy 13 (Patau syndrome): NEGATIVE
Trisomy 18 (Edwards syndrome): NEGATIVE
Trisomy 21 (Down syndrome): NEGATIVE

## 2021-10-29 LAB — URINE DRUG PANEL 7
Amphetamines, Urine: NEGATIVE ng/mL
Barbiturate Quant, Ur: NEGATIVE ng/mL
Benzodiazepine Quant, Ur: NEGATIVE ng/mL
Cannabinoid Quant, Ur: POSITIVE — AB
Cocaine (Metab.): POSITIVE — AB
Opiate Quant, Ur: NEGATIVE ng/mL
PCP Quant, Ur: NEGATIVE ng/mL

## 2021-11-18 ENCOUNTER — Other Ambulatory Visit: Payer: Self-pay | Admitting: Obstetrics and Gynecology

## 2021-11-18 DIAGNOSIS — Z3A28 28 weeks gestation of pregnancy: Secondary | ICD-10-CM

## 2021-11-19 ENCOUNTER — Encounter: Payer: Self-pay | Admitting: Obstetrics and Gynecology

## 2021-11-19 ENCOUNTER — Ambulatory Visit (INDEPENDENT_AMBULATORY_CARE_PROVIDER_SITE_OTHER): Payer: Medicaid Other | Admitting: Obstetrics and Gynecology

## 2021-11-19 ENCOUNTER — Other Ambulatory Visit: Payer: Medicaid Other

## 2021-11-19 VITALS — BP 127/75 | HR 84 | Wt 188.8 lb

## 2021-11-19 DIAGNOSIS — O98812 Other maternal infectious and parasitic diseases complicating pregnancy, second trimester: Secondary | ICD-10-CM

## 2021-11-19 DIAGNOSIS — A599 Trichomoniasis, unspecified: Secondary | ICD-10-CM | POA: Insufficient documentation

## 2021-11-19 DIAGNOSIS — F1990 Other psychoactive substance use, unspecified, uncomplicated: Secondary | ICD-10-CM | POA: Diagnosis not present

## 2021-11-19 DIAGNOSIS — Z23 Encounter for immunization: Secondary | ICD-10-CM

## 2021-11-19 DIAGNOSIS — D649 Anemia, unspecified: Secondary | ICD-10-CM

## 2021-11-19 DIAGNOSIS — O99322 Drug use complicating pregnancy, second trimester: Secondary | ICD-10-CM

## 2021-11-19 DIAGNOSIS — O0992 Supervision of high risk pregnancy, unspecified, second trimester: Secondary | ICD-10-CM | POA: Insufficient documentation

## 2021-11-19 DIAGNOSIS — O99012 Anemia complicating pregnancy, second trimester: Secondary | ICD-10-CM | POA: Insufficient documentation

## 2021-11-19 DIAGNOSIS — Z3482 Encounter for supervision of other normal pregnancy, second trimester: Secondary | ICD-10-CM

## 2021-11-19 DIAGNOSIS — Z3480 Encounter for supervision of other normal pregnancy, unspecified trimester: Secondary | ICD-10-CM

## 2021-11-19 DIAGNOSIS — Z3009 Encounter for other general counseling and advice on contraception: Secondary | ICD-10-CM

## 2021-11-19 DIAGNOSIS — O0930 Supervision of pregnancy with insufficient antenatal care, unspecified trimester: Secondary | ICD-10-CM | POA: Insufficient documentation

## 2021-11-19 DIAGNOSIS — Z3A26 26 weeks gestation of pregnancy: Secondary | ICD-10-CM

## 2021-11-19 LAB — POCT URINALYSIS DIPSTICK OB
Bilirubin, UA: NEGATIVE
Blood, UA: NEGATIVE
Glucose, UA: NEGATIVE
Ketones, UA: NEGATIVE
Nitrite, UA: NEGATIVE
Spec Grav, UA: 1.02 (ref 1.010–1.025)
Urobilinogen, UA: 0.2 E.U./dL
pH, UA: 7 (ref 5.0–8.0)

## 2021-11-19 MED ORDER — METRONIDAZOLE 500 MG PO TABS: ORAL_TABLET | ORAL | 1 refills | Status: DC

## 2021-11-19 MED ORDER — FUSION PLUS PO CAPS: 1.0000 | ORAL_CAPSULE | Freq: Every day | ORAL | 4 refills | Status: AC

## 2021-11-19 NOTE — Progress Notes (Signed)
ROB. She states daily fetal movement with increased pressure. Patient completed GCT, received TDAP injection and signed BTC today. Patient states no questions or concerns at this time.

## 2021-11-19 NOTE — Patient Instructions (Signed)
Trichomoniasis Trichomoniasis is a sexually transmitted infection (STI). Many people with trichomoniasis do not have any symptoms (are asymptomatic) or have only minimal symptoms. Untreated trichomoniasis can last from months to years. This condition is treated with medicine. What are the causes? This condition is caused by a parasite called Trichomonas vaginalis and is transmitted during sexual contact. What increases the risk? The following factors may make you more likely to develop this condition: Having unprotected sex. Having sex with a partner who has trichomoniasis. Having multiple sexual partners. Having had previous trichomoniasis infections or other STIs. What are the signs or symptoms? In females, symptoms of trichomoniasis include: Itching, burning, redness, or soreness in the genital area. Discomfort while urinating. Abnormal vaginal discharge that is clear, white, gray, or yellow-green and foamy and has an unusual fishy odor. In males, symptoms of trichomoniasis include: Discharge from the penis. Burning after urination or ejaculation. Itching or discomfort inside the penis. How is this diagnosed? This condition is diagnosed based on tests. To perform a test, your health care provider will do one of the following: Ask you to provide a urine sample. Take a sample of discharge. The sample may be taken from the vagina or cervix in females and from the urethra in males. Your health care provider may use a swab to collect the sample. Your health care provider may test you for other STIs, including human immunodeficiency virus (HIV). How is this treated?  This condition is treated with medicines such as metronidazole or tinidazole. These are called antimicrobial medicines, and they are taken by mouth (orally). Your sexual partner or partners also need to be tested and treated. If they have the infection and are not treated, you will likely get reinfected. If you plan to become  pregnant or think you may be pregnant, tell your health care provider right away. Some medicines that are used to treat the infection should not be taken during pregnancy. Your health care provider may recommend over-the-counter medicines or creams to help relieve itching or irritation. You may be tested for the infection again 3 months after treatment. Follow these instructions at home: Medicines Take over-the-counter and prescription medicines only as told by your health care provider. Take your antimicrobial medicine as told by your health care provider. Do not stop taking it even if you start to feel better. Use creams as told by your health care provider. General instructions Do not have sex until after you finish your medicine and your symptoms have resolved. Do not wear tampons while you have the infection (if you are female). Talk with your sexual partner or partners about any symptoms that either of you may have, as well as any history of STIs. Keep all follow-up visits. This is important. How is this prevented?  Use condoms every time you have sex. Using condoms correctly and consistently can help protect against STIs. Do not have sexual contact if you have symptoms of trichomoniasis or another STI. Avoid having multiple sexual partners. Get tested for STIs before you have sex with a partner. Ask all partners to do the same. Do not douche (if you are female). Douching may increase your risk for getting STIs due to the removal of good bacteria in the vagina. Contact a health care provider if: You still have symptoms after you finish your medicine. You develop a rash. You plan to become pregnant or think you may be pregnant. Summary Trichomoniasis is a sexually transmitted infection (STI). This condition often has no symptoms or minimal   symptoms. Take your antimicrobial medicine as told by your health care provider. Do not stop taking even if you start to feel better. Discuss your  infection with your sexual partner or partners. Make sure that all partners get tested and treated, if necessary. You should not have sex until after you finish your medicine and your symptoms have resolved. Keep all follow-up visits. This is important. This information is not intended to replace advice given to you by your health care provider. Make sure you discuss any questions you have with your health care provider. Document Revised: 11/25/2020 Document Reviewed: 11/25/2020 Elsevier Patient Education  2023 Elsevier Inc.  

## 2021-11-19 NOTE — Progress Notes (Signed)
ROB: ROB: Patient notes feeling pelvic pressure. Usually noted after standing all day at work. Discussed use of Belly band.  Reviewed labs, patient with trichomonas infection noted on pap smear, notes she was unaware of results, has not received treatment. Discussed patient and partner treatment.  For 28 week labs today.  Plans to breastfeed, desires Tubal Ligation for contraception, signed Medicaid consent form. For Tdap today, signed blood consent.  Flu vaccine given today. Sent her iron prescription to desired pharmacy. RTC in 2 weeks. Needs TOC for trichomonas infection in 4 weeks and UDS re-screen for h/o substance abuse in early pregnancy.

## 2021-11-19 NOTE — Addendum Note (Signed)
Addended by: Loman Chroman on: 11/19/2021 09:51 AM   Modules accepted: Orders

## 2021-11-20 LAB — CBC
Hematocrit: 28.5 % — ABNORMAL LOW (ref 34.0–46.6)
Hemoglobin: 9 g/dL — ABNORMAL LOW (ref 11.1–15.9)
MCH: 22.4 pg — ABNORMAL LOW (ref 26.6–33.0)
MCHC: 31.6 g/dL (ref 31.5–35.7)
MCV: 71 fL — ABNORMAL LOW (ref 79–97)
Platelets: 183 10*3/uL (ref 150–450)
RBC: 4.02 x10E6/uL (ref 3.77–5.28)
RDW: 14.5 % (ref 11.7–15.4)
WBC: 6.4 10*3/uL (ref 3.4–10.8)

## 2021-11-20 LAB — GLUCOSE, 1 HOUR GESTATIONAL: Gestational Diabetes Screen: 149 mg/dL — ABNORMAL HIGH (ref 70–139)

## 2021-11-20 LAB — RPR: RPR Ser Ql: NONREACTIVE

## 2021-11-22 ENCOUNTER — Encounter: Payer: Self-pay | Admitting: Obstetrics and Gynecology

## 2021-11-22 ENCOUNTER — Other Ambulatory Visit: Payer: Self-pay | Admitting: Obstetrics and Gynecology

## 2021-11-25 ENCOUNTER — Other Ambulatory Visit: Payer: Self-pay

## 2021-11-25 DIAGNOSIS — O99810 Abnormal glucose complicating pregnancy: Secondary | ICD-10-CM

## 2021-12-07 ENCOUNTER — Encounter: Payer: Medicaid Other | Admitting: Obstetrics and Gynecology

## 2021-12-07 ENCOUNTER — Other Ambulatory Visit: Payer: Medicaid Other

## 2021-12-07 ENCOUNTER — Telehealth: Payer: Self-pay | Admitting: Obstetrics and Gynecology

## 2021-12-07 NOTE — Telephone Encounter (Signed)
Reached out to pt to reschedule 3 hour glucose test that was scheduled for 11/28 at 9:20.  Left message for pt to call back to reschedule.

## 2021-12-08 ENCOUNTER — Encounter: Payer: Self-pay | Admitting: Obstetrics and Gynecology

## 2021-12-08 NOTE — Telephone Encounter (Signed)
Reached out to pt (2x) to reschedule 3 hr glucose test that was scheduled for 11/28 at 9:20.  Left message for pt to call back to reschedule.  Will send a Medical laboratory scientific officer.

## 2021-12-10 ENCOUNTER — Encounter: Payer: Self-pay | Admitting: Medical

## 2021-12-10 ENCOUNTER — Encounter: Payer: Medicaid Other | Admitting: Medical

## 2021-12-10 DIAGNOSIS — O99810 Abnormal glucose complicating pregnancy: Secondary | ICD-10-CM | POA: Insufficient documentation

## 2021-12-13 ENCOUNTER — Telehealth: Payer: Self-pay | Admitting: Medical

## 2021-12-13 NOTE — Telephone Encounter (Signed)
Reached out to pt to reschedule OB appt that was scheduled on 12/10/21 with Vonzella Nipple at 8:55.  Pt also needs to reschedule 3 hr GTT.  Could not leave message bc call could not be completed.

## 2021-12-14 ENCOUNTER — Encounter: Payer: Self-pay | Admitting: Medical

## 2021-12-14 NOTE — Telephone Encounter (Signed)
Reached out to pt (2x) to reschedule OB appt that was scheduled on 12/10/2021 with Vonzella Nipple at 8:55.  Pt also needs to reschedule 3 hr GTT.  Could not leave a message bc call could not be completed.  Will send a Medical laboratory scientific officer.

## 2022-01-10 NOTE — L&D Delivery Note (Signed)
Date of delivery: 02/07/22 Estimated Date of Delivery: 02/18/22 EGA: [redacted]w[redacted]d  Hospital Course summary: Quinnley Colasurdo is a 32 y.o. [id number] @ [redacted]w[redacted]d who was admitted on 02/07/2022 in active labor.   Delivery Note At 8:14 AM a viable female was delivered via Vaginal, Spontaneous (Presentation:      OA).  APGAR: , ; weight  .   Placenta status: Spontaneous, Intact.  Cord: 3 vessels  Anesthesia: None Episiotomy: None Lacerations: None Est. Blood Loss (mL): 150  Mom to postpartum.  Baby to Couplet care / Skin to Skin.  Maggie Font 02/07/2022, 8:32 AM    Delivery Narrative  Alvilda Mckenna was complete at 6195 and began pushing at [facility]. She delivered a vigorous female infant at 67. Apgars not yet recorded. The head followed by shoulders which were delivered without difficulty, and the rest of the body. Baby was immediately placed skin to skin on mother's chest. No Nuchal cord was noted. Delayed cord clamping with cord clamped by CNM and cut by Neoma Laming. Cord blood was obtained. Placenta was delivered at 0818, primarily by maternal efforts and with some slight cord traction. Placenta was intact, Shultz mechanism, 3 VC. Perineum inspected and found to be intact. AMSTL with IV pitocin per unit protocol. EBL 150. Mother and baby stable.

## 2022-02-06 ENCOUNTER — Other Ambulatory Visit: Payer: Self-pay

## 2022-02-06 ENCOUNTER — Observation Stay (HOSPITAL_BASED_OUTPATIENT_CLINIC_OR_DEPARTMENT_OTHER)
Admission: EM | Admit: 2022-02-06 | Discharge: 2022-02-07 | Disposition: A | Discharge status: Home / Self Care | Payer: Medicaid Other | Source: Home / Self Care | Admitting: Family

## 2022-02-06 ENCOUNTER — Encounter: Payer: Self-pay | Admitting: Obstetrics and Gynecology

## 2022-02-06 DIAGNOSIS — D649 Anemia, unspecified: Secondary | ICD-10-CM | POA: Insufficient documentation

## 2022-02-06 DIAGNOSIS — O09523 Supervision of elderly multigravida, third trimester: Secondary | ICD-10-CM | POA: Insufficient documentation

## 2022-02-06 DIAGNOSIS — O99013 Anemia complicating pregnancy, third trimester: Secondary | ICD-10-CM | POA: Insufficient documentation

## 2022-02-06 DIAGNOSIS — Z3A38 38 weeks gestation of pregnancy: Secondary | ICD-10-CM | POA: Insufficient documentation

## 2022-02-06 DIAGNOSIS — O479 False labor, unspecified: Secondary | ICD-10-CM | POA: Diagnosis present

## 2022-02-06 LAB — SAMPLE TO BLOOD BANK

## 2022-02-06 LAB — CBC
HCT: 26.6 % — ABNORMAL LOW (ref 36.0–46.0)
Hemoglobin: 8.3 g/dL — ABNORMAL LOW (ref 12.0–15.0)
MCH: 21.6 pg — ABNORMAL LOW (ref 26.0–34.0)
MCHC: 31.2 g/dL (ref 30.0–36.0)
MCV: 69.1 fL — ABNORMAL LOW (ref 80.0–100.0)
Platelets: 115 10*3/uL — ABNORMAL LOW (ref 150–400)
RBC: 3.85 MIL/uL — ABNORMAL LOW (ref 3.87–5.11)
RDW: 14.2 % (ref 11.5–15.5)
WBC: 6.2 10*3/uL (ref 4.0–10.5)
nRBC: 0 % (ref 0.0–0.2)

## 2022-02-06 LAB — URINE DRUG SCREEN, QUALITATIVE (ARMC ONLY)
Amphetamines, Ur Screen: NOT DETECTED
Barbiturates, Ur Screen: NOT DETECTED
Benzodiazepine, Ur Scrn: NOT DETECTED
Cannabinoid 50 Ng, Ur ~~LOC~~: POSITIVE — AB
Cocaine Metabolite,Ur ~~LOC~~: POSITIVE — AB
MDMA (Ecstasy)Ur Screen: NOT DETECTED
Methadone Scn, Ur: NOT DETECTED
Opiate, Ur Screen: NOT DETECTED
Phencyclidine (PCP) Ur S: NOT DETECTED
Tricyclic, Ur Screen: NOT DETECTED

## 2022-02-06 LAB — WET PREP, GENITAL
Clue Cells Wet Prep HPF POC: NONE SEEN
Sperm: NONE SEEN
Trich, Wet Prep: NONE SEEN
WBC, Wet Prep HPF POC: <10 — AB (ref <10)
Yeast Wet Prep HPF POC: NONE SEEN

## 2022-02-06 LAB — CHLAMYDIA/NGC RT PCR (ARMC ONLY)
Chlamydia Tr: NOT DETECTED
N gonorrhoeae: NOT DETECTED

## 2022-02-06 LAB — RUPTURE OF MEMBRANE (ROM)PLUS: Rom Plus: NEGATIVE

## 2022-02-06 LAB — GROUP B STREP BY PCR: Group B strep by PCR: POSITIVE — AB

## 2022-02-06 MED ORDER — SODIUM CHLORIDE 0.9 % IV SOLN
500.0000 mg | Freq: Once | INTRAVENOUS | Status: AC
  Administered 2022-02-06: 500 mg via INTRAVENOUS
  Filled 2022-02-06: qty 25

## 2022-02-06 MED ORDER — LACTATED RINGERS IV SOLN: INTRAVENOUS | Status: DC

## 2022-02-06 NOTE — Progress Notes (Signed)
  OB triage Note   32 y.o. [id number] @ [redacted]w[redacted]d by 21wk5d u/s Brianna Jarvis was late to care and has had limited prenatal with last PNV on 11/20/22 and only two visits total. Pregnancy is also complicated by anemia (although not taking oral Fe), failed 1hr GTT and yet to do 3hr GTT and substance abuse with UDS pos for cocaine and THC on 10/13., and positive trichomonis on 10/16, has not yet had TOC.   With limited prenatal care decided to repeat CBC which was significant for hgb of 8.3, discussed IV Fe, pt is amendable. Will also repeat wet prep for TOC for trich. GBS, UDS and GC/CT collected today.   Subjective:  Patient presents with concerns about labor and SROM. Around 2pm while using the restroom she felt like fluid kept coming while sitting on the toliet after she felt she was done urinating. Has not had leaking since. Also reports contractions are are spaced apart. After being here for a little she feels the contractions have become less intense and spaced further. Endorses fetal movement. Denies VB.   Objective:  Ht 5\' 6"  (1.676 m)   Wt 85.3 kg   LMP 05/03/2021 (Approximate) Comment: period was light and only 3 days per pt  BMI 30.34 kg/m  Abd: gravid SVE: 2cm, after two hours was still 2cm/50%/-2, vertex  EFM: 140, mod variability, pos accels, no decels Toco: rare contractions  ROM plus neg Hgb 8.3 GC/CT neg GBS pending UDS pending  Assessment  [id number] @ [redacted]w[redacted]d Not in labor VSS RNST Anemic   Plan:   1. Patient open to IV Fe, will give infusion in OB triage 2. Wet prep for TOC from previous trich results 3. Other labs: GBS as is term and has not been done, UDS due to limited care   All discussed with patient  [place], [facility] 02/06/2022  8:19 PM

## 2022-02-06 NOTE — OB Triage Note (Signed)
Pt co LOF since 1400 today. States leaking is clear, no odor. Denies vaginal bleeding. Reports + fetal movement. States ctx started around 1000 this am. Juliene Pina, Zettie Pho

## 2022-02-07 ENCOUNTER — Inpatient Hospital Stay
Admission: EM | Admit: 2022-02-07 | Discharge: 2022-02-09 | DRG: 806 | Disposition: A | Discharge status: Home / Self Care | Payer: Medicaid Other | Attending: Certified Nurse Midwife | Admitting: Certified Nurse Midwife

## 2022-02-07 ENCOUNTER — Encounter: Payer: Self-pay | Admitting: Family

## 2022-02-07 DIAGNOSIS — O99820 Streptococcus B carrier state complicating pregnancy: Secondary | ICD-10-CM | POA: Diagnosis not present

## 2022-02-07 DIAGNOSIS — O26893 Other specified pregnancy related conditions, third trimester: Secondary | ICD-10-CM | POA: Diagnosis present

## 2022-02-07 DIAGNOSIS — O99323 Drug use complicating pregnancy, third trimester: Secondary | ICD-10-CM

## 2022-02-07 DIAGNOSIS — O0933 Supervision of pregnancy with insufficient antenatal care, third trimester: Secondary | ICD-10-CM

## 2022-02-07 DIAGNOSIS — D649 Anemia, unspecified: Secondary | ICD-10-CM | POA: Diagnosis not present

## 2022-02-07 DIAGNOSIS — F1410 Cocaine abuse, uncomplicated: Secondary | ICD-10-CM | POA: Diagnosis present

## 2022-02-07 DIAGNOSIS — O479 False labor, unspecified: Principal | ICD-10-CM | POA: Diagnosis present

## 2022-02-07 DIAGNOSIS — O4202 Full-term premature rupture of membranes, onset of labor within 24 hours of rupture: Secondary | ICD-10-CM

## 2022-02-07 DIAGNOSIS — O9902 Anemia complicating childbirth: Secondary | ICD-10-CM | POA: Diagnosis present

## 2022-02-07 DIAGNOSIS — O99824 Streptococcus B carrier state complicating childbirth: Secondary | ICD-10-CM | POA: Diagnosis present

## 2022-02-07 DIAGNOSIS — Z3A38 38 weeks gestation of pregnancy: Secondary | ICD-10-CM | POA: Diagnosis not present

## 2022-02-07 DIAGNOSIS — O99324 Drug use complicating childbirth: Secondary | ICD-10-CM | POA: Diagnosis present

## 2022-02-07 DIAGNOSIS — F1990 Other psychoactive substance use, unspecified, uncomplicated: Secondary | ICD-10-CM | POA: Diagnosis not present

## 2022-02-07 DIAGNOSIS — F1210 Cannabis abuse, uncomplicated: Secondary | ICD-10-CM | POA: Diagnosis present

## 2022-02-07 DIAGNOSIS — D62 Acute posthemorrhagic anemia: Secondary | ICD-10-CM | POA: Diagnosis not present

## 2022-02-07 DIAGNOSIS — O99013 Anemia complicating pregnancy, third trimester: Secondary | ICD-10-CM

## 2022-02-07 LAB — COMPREHENSIVE METABOLIC PANEL
ALT: 22 U/L (ref 0–44)
AST: 41 U/L (ref 15–41)
Albumin: 3.5 g/dL (ref 3.5–5.0)
Alkaline Phosphatase: 149 U/L — ABNORMAL HIGH (ref 38–126)
Anion gap: 11 (ref 5–15)
BUN: <5 mg/dL — ABNORMAL LOW (ref 6–20)
CO2: 20 mmol/L — ABNORMAL LOW (ref 22–32)
Calcium: 8.9 mg/dL (ref 8.9–10.3)
Chloride: 103 mmol/L (ref 98–111)
Creatinine, Ser: 0.6 mg/dL (ref 0.44–1.00)
GFR, Estimated: >60 mL/min (ref >60)
Glucose, Bld: 115 mg/dL — ABNORMAL HIGH (ref 70–99)
Potassium: 3.6 mmol/L (ref 3.5–5.1)
Sodium: 134 mmol/L — ABNORMAL LOW (ref 135–145)
Total Bilirubin: 0.9 mg/dL (ref 0.3–1.2)
Total Protein: 7 g/dL (ref 6.5–8.1)

## 2022-02-07 LAB — CBC WITH DIFFERENTIAL/PLATELET
Abs Immature Granulocytes: 0.05 10*3/uL (ref 0.00–0.07)
Basophils Absolute: 0 10*3/uL (ref 0.0–0.1)
Basophils Relative: 0 %
Eosinophils Absolute: 0 10*3/uL (ref 0.0–0.5)
Eosinophils Relative: 0 %
HCT: 30.8 % — ABNORMAL LOW (ref 36.0–46.0)
Hemoglobin: 9.6 g/dL — ABNORMAL LOW (ref 12.0–15.0)
Immature Granulocytes: 1 %
Lymphocytes Relative: 9 %
Lymphs Abs: 0.9 10*3/uL (ref 0.7–4.0)
MCH: 21.8 pg — ABNORMAL LOW (ref 26.0–34.0)
MCHC: 31.2 g/dL (ref 30.0–36.0)
MCV: 69.8 fL — ABNORMAL LOW (ref 80.0–100.0)
Monocytes Absolute: 0.4 10*3/uL (ref 0.1–1.0)
Monocytes Relative: 4 %
Neutro Abs: 9.1 10*3/uL — ABNORMAL HIGH (ref 1.7–7.7)
Neutrophils Relative %: 86 %
Platelets: 121 10*3/uL — ABNORMAL LOW (ref 150–400)
RBC: 4.41 MIL/uL (ref 3.87–5.11)
RDW: 14.1 % (ref 11.5–15.5)
WBC: 10.5 10*3/uL (ref 4.0–10.5)
nRBC: 0 % (ref 0.0–0.2)

## 2022-02-07 LAB — PROTEIN / CREATININE RATIO, URINE
Creatinine, Urine: 91 mg/dL
Protein Creatinine Ratio: 1.36 mg/mg{Cre} — ABNORMAL HIGH (ref 0.00–0.15)
Total Protein, Urine: 124 mg/dL

## 2022-02-07 LAB — URINE DRUG SCREEN, QUALITATIVE (ARMC ONLY)
Amphetamines, Ur Screen: NOT DETECTED
Barbiturates, Ur Screen: NOT DETECTED
Benzodiazepine, Ur Scrn: NOT DETECTED
Cannabinoid 50 Ng, Ur ~~LOC~~: POSITIVE — AB
Cocaine Metabolite,Ur ~~LOC~~: POSITIVE — AB
MDMA (Ecstasy)Ur Screen: NOT DETECTED
Methadone Scn, Ur: NOT DETECTED
Opiate, Ur Screen: NOT DETECTED
Phencyclidine (PCP) Ur S: NOT DETECTED
Tricyclic, Ur Screen: NOT DETECTED

## 2022-02-07 LAB — TYPE AND SCREEN
ABO/RH(D): A POS
Antibody Screen: NEGATIVE

## 2022-02-07 MED ORDER — WITCH HAZEL-GLYCERIN EX PADS: 1.0000 | MEDICATED_PAD | CUTANEOUS | Status: DC | PRN

## 2022-02-07 MED ORDER — OXYTOCIN BOLUS FROM INFUSION
333.0000 mL | Freq: Once | INTRAVENOUS | Status: AC
  Administered 2022-02-07: 333 mL via INTRAVENOUS

## 2022-02-07 MED ORDER — LABETALOL HCL 5 MG/ML IV SOLN: 40.0000 mg | INTRAVENOUS | Status: DC | PRN

## 2022-02-07 MED ORDER — SODIUM CHLORIDE 0.9 % IV SOLN
INTRAVENOUS | Status: AC
  Administered 2022-02-07: 5 10*6.[IU] via INTRAVENOUS
  Filled 2022-02-07: qty 5

## 2022-02-07 MED ORDER — TETANUS-DIPHTH-ACELL PERTUSSIS 5-2.5-18.5 LF-MCG/0.5 IM SUSY: 0.5000 mL | PREFILLED_SYRINGE | Freq: Once | INTRAMUSCULAR | Status: DC

## 2022-02-07 MED ORDER — ONDANSETRON HCL 4 MG/2ML IJ SOLN: 4.0000 mg | Freq: Four times a day (QID) | INTRAMUSCULAR | Status: DC | PRN

## 2022-02-07 MED ORDER — LACTATED RINGERS IV SOLN: 500.0000 mL | INTRAVENOUS | Status: DC | PRN

## 2022-02-07 MED ORDER — OXYTOCIN-SODIUM CHLORIDE 30-0.9 UT/500ML-% IV SOLN
INTRAVENOUS | Status: AC
  Filled 2022-02-07: qty 500

## 2022-02-07 MED ORDER — LIDOCAINE HCL (PF) 1 % IJ SOLN: 30.0000 mL | INTRAMUSCULAR | Status: DC | PRN

## 2022-02-07 MED ORDER — SODIUM CHLORIDE 0.9 % IV SOLN: 5.0000 10*6.[IU] | Freq: Once | INTRAVENOUS | Status: AC

## 2022-02-07 MED ORDER — MISOPROSTOL 200 MCG PO TABS
ORAL_TABLET | ORAL | Status: AC
  Filled 2022-02-07: qty 4

## 2022-02-07 MED ORDER — DIPHENHYDRAMINE HCL 25 MG PO CAPS: 25.0000 mg | ORAL_CAPSULE | Freq: Four times a day (QID) | ORAL | Status: DC | PRN

## 2022-02-07 MED ORDER — IBUPROFEN 600 MG PO TABS
600.0000 mg | ORAL_TABLET | Freq: Four times a day (QID) | ORAL | Status: DC
  Administered 2022-02-07 – 2022-02-09 (×6): 600 mg via ORAL
  Filled 2022-02-07 (×6): qty 1

## 2022-02-07 MED ORDER — PENICILLIN G POTASSIUM 5000000 UNITS IJ SOLR
2.5000 10*6.[IU] | INTRAVENOUS | Status: DC
  Filled 2022-02-07 (×3): qty 2.5

## 2022-02-07 MED ORDER — ONDANSETRON HCL 4 MG/2ML IJ SOLN: 4.0000 mg | INTRAMUSCULAR | Status: DC | PRN

## 2022-02-07 MED ORDER — LABETALOL HCL 5 MG/ML IV SOLN: 80.0000 mg | INTRAVENOUS | Status: DC | PRN

## 2022-02-07 MED ORDER — ACETAMINOPHEN 325 MG PO TABS
650.0000 mg | ORAL_TABLET | ORAL | Status: DC | PRN
  Administered 2022-02-07 (×3): 650 mg via ORAL
  Filled 2022-02-07 (×3): qty 2

## 2022-02-07 MED ORDER — SENNOSIDES-DOCUSATE SODIUM 8.6-50 MG PO TABS
2.0000 | ORAL_TABLET | Freq: Every day | ORAL | Status: DC
  Administered 2022-02-08 – 2022-02-09 (×2): 2 via ORAL
  Filled 2022-02-07 (×2): qty 2

## 2022-02-07 MED ORDER — DIBUCAINE (PERIANAL) 1 % EX OINT: 1.0000 | TOPICAL_OINTMENT | CUTANEOUS | Status: DC | PRN

## 2022-02-07 MED ORDER — OXYTOCIN 10 UNIT/ML IJ SOLN
INTRAMUSCULAR | Status: AC
  Filled 2022-02-07: qty 2

## 2022-02-07 MED ORDER — LACTATED RINGERS IV SOLN: INTRAVENOUS | Status: DC

## 2022-02-07 MED ORDER — PENICILLIN G POT IN DEXTROSE 60000 UNIT/ML IV SOLN: 3.0000 10*6.[IU] | INTRAVENOUS | Status: DC

## 2022-02-07 MED ORDER — COCONUT OIL OIL: 1.0000 | TOPICAL_OIL | Status: DC | PRN

## 2022-02-07 MED ORDER — SOD CITRATE-CITRIC ACID 500-334 MG/5ML PO SOLN: 30.0000 mL | ORAL | Status: DC | PRN

## 2022-02-07 MED ORDER — IBUPROFEN 600 MG PO TABS
ORAL_TABLET | ORAL | Status: AC
  Administered 2022-02-08: 600 mg via ORAL
  Filled 2022-02-07: qty 1

## 2022-02-07 MED ORDER — LIDOCAINE HCL (PF) 1 % IJ SOLN
INTRAMUSCULAR | Status: AC
  Filled 2022-02-07: qty 30

## 2022-02-07 MED ORDER — OXYTOCIN-SODIUM CHLORIDE 30-0.9 UT/500ML-% IV SOLN
2.5000 [IU]/h | INTRAVENOUS | Status: DC
  Administered 2022-02-07: 2.5 [IU]/h via INTRAVENOUS

## 2022-02-07 MED ORDER — AMMONIA AROMATIC IN INHA
RESPIRATORY_TRACT | Status: AC
  Filled 2022-02-07: qty 10

## 2022-02-07 MED ORDER — BENZOCAINE-MENTHOL 20-0.5 % EX AERO
1.0000 | INHALATION_SPRAY | CUTANEOUS | Status: DC | PRN
  Filled 2022-02-07: qty 56

## 2022-02-07 MED ORDER — LABETALOL HCL 5 MG/ML IV SOLN: 20.0000 mg | INTRAVENOUS | Status: DC | PRN

## 2022-02-07 MED ORDER — SIMETHICONE 80 MG PO CHEW: 80.0000 mg | CHEWABLE_TABLET | ORAL | Status: DC | PRN

## 2022-02-07 MED ORDER — ZOLPIDEM TARTRATE 5 MG PO TABS: 5.0000 mg | ORAL_TABLET | Freq: Every evening | ORAL | Status: DC | PRN

## 2022-02-07 MED ORDER — HYDRALAZINE HCL 20 MG/ML IJ SOLN: 10.0000 mg | INTRAMUSCULAR | Status: DC | PRN

## 2022-02-07 MED ORDER — ONDANSETRON HCL 4 MG PO TABS: 4.0000 mg | ORAL_TABLET | ORAL | Status: DC | PRN

## 2022-02-07 MED ORDER — ACETAMINOPHEN 325 MG PO TABS: 650.0000 mg | ORAL_TABLET | ORAL | Status: DC | PRN

## 2022-02-07 NOTE — OB Triage Note (Signed)
Pt arrived to unit by EMS with NSL in place with complaints of SROM at 0430. Pt reports contraction pain and reports clear fluid. Upon RN assessment patient is grossly ruptured clear fluid. Patient placed on EFM and provider notified of patient arrival. Patient is sweaty and calm/ falling asleep between contractions. Will notify provider of patient's arrival.

## 2022-02-07 NOTE — H&P (Addendum)
History and Physical   HPI  Brianna Jarvis is a 32 y.o. [id number] at [redacted]w[redacted]d Estimated Date of Delivery: 02/18/22 by 21wk5d u/s, who is being admitted for uterine contractions and SROM. Reports that she SROM'd for clear fluid at 0430 and has had contractions closer together. Report good fetal movement. Denies VB, HA, visual changes or RUQ pain.   Brianna Jarvis was late to care (started at 23 weeks) and has had limited prenatal with last PNV on 11/20/22 and only two visits total. Pregnancy is also complicated by anemia (although not taking oral Fe), failed 1hr GTT and yet to do 3hr GTT and substance abuse with UDS pos for cocaine and THC on 10/13., and positive trichomonis on 10/16, and neg TOC 1/28.  She presented to [facility] triage last night for labor, had contractions that were spaced and had no cervical changed. Was found to be anemic with a hgb of 8.3 and was given an iron infusion and sent home, several hours later reported SROM and now closer contractions. UDS last night was also positive for cocaine and THC. She is GBS positive.   OB History  OB History  Gravida Para Term Preterm AB Living  6 3 3  0 2 3  SAB IAB Ectopic Multiple Live Births  2 0 0 0 3    # Outcome Date GA Lbr Len/2nd Weight Sex Delivery Anes PTL Lv  6 Current           5 SAB 2022          4 SAB 2021          3 Term 03/02/16 [redacted]w[redacted]d  3005 g M   N LIV  2 Term 12/08/11 [redacted]w[redacted]d  3005 g F Vag-Spont  N LIV  1 Term 10/05/05 [redacted]w[redacted]d  3005 g M   N LIV    PROBLEM LIST  Pregnancy complications or risks: Patient Active Problem List   Diagnosis Date Noted   Uterine contractions 02/06/2022   Impaired glucose tolerance during pregnancy, antepartum 12/10/2021   Late prenatal care 11/19/2021   Substance use disorder 11/19/2021   Trichomoniasis 11/19/2021   Anemia of pregnancy in second trimester 11/19/2021   Pregnancy, supervision, high-risk 10/12/2021    Prenatal labs and studies: ABO, Rh: A/Positive/-- (10/03 1044) Antibody:  Negative (10/03 1044) Rubella: 3.70 (10/03 1044) RPR: Non Reactive (11/10 1006)  HBsAg: Negative (10/03 1044)  HIV: Non Reactive (10/03 1044)  03-06-1989-- (01/28 1827) GC/CT: neg Wet Prep: neg UDS: positive for cocaine and THC 1 hr GTT 149, 3 hr GTT  not completed H&H:  Hemoglobin  Date Value Ref Range Status  02/06/2022 8.3 (L) 12.0 - 15.0 g/dL Final    Comment:    Reticulocyte Hemoglobin testing may be clinically indicated, consider ordering this additional test 02/08/2022   11/19/2021 9.0 (L) 11.1 - 15.9 g/dL Final  13/10/2021 9.2 (L) 11.1 - 15.9 g/dL Final  02/40/9735 32/99/2426 (L) 12.0 - 15.0 g/dL Final  83.4 19/62/2297 (L) 12.0 - 15.0 g/dL Final   HGB  Date Value Ref Range Status  12/08/2011 10.1 (L) 12.0 - 16.0 g/dL Final  12/10/2011 9.5 (L) 12.0 - 16.0 g/dL Final  21/19/4174 9.9 (L) 12.0 - 16.0 g/dL Final     Past Medical History:  Diagnosis Date   Heart murmur    Ovarian cyst      Past Surgical History:  Procedure Laterality Date   WISDOM TOOTH EXTRACTION     four; 2017     Medications  Current Discharge Medication List     CONTINUE these medications which have NOT CHANGED   Details  Iron-FA-B Cmp-C-Biot-Probiotic (FUSION PLUS) CAPS Take 1 tablet by mouth daily. Qty: 30 capsule, Refills: 4   Associated Diagnoses: Pregnancy, supervision, high-risk, second trimester; [redacted] weeks gestation of pregnancy         Allergies  Patient has no known allergies.  Review of Systems  Pertinent items are noted in HPI.  Physical Exam  LMP 05/03/2021 (Approximate) Comment: period was light and only 3 days per pt  Lungs:  CTA B Cardio: S1S2, RRR Abd: Soft, gravid, NT Presentation: cephalic DTRs: 2+ B SVE: 3KV/42%/5   FHT: 130, mod variability, pos acels, no decels Toco Ucs q3-28min   Test Results  Results for orders placed or performed during the hospital encounter of 02/06/22 (from the past 24 hour(s))  Group B strep by PCR     Status:  Abnormal   Collection Time: 02/06/22  6:27 PM   Specimen: Vaginal/Rectal; Genital  Result Value Ref Range   Group B strep by PCR POSITIVE (A) NEGATIVE  Chlamydia/NGC rt PCR ([facility] only)     Status: None   Collection Time: 02/06/22  6:27 PM   Specimen: Cervical/Vaginal swab; Genital  Result Value Ref Range   Specimen source GC/Chlam ENDOCERVICAL    Chlamydia Tr NOT DETECTED NOT DETECTED   N gonorrhoeae NOT DETECTED NOT DETECTED  ROM Plus ([facility] only)     Status: None   Collection Time: 02/06/22  6:28 PM  Result Value Ref Range   Rom Plus NEGATIVE   Sample to Blood Bank     Status: None   Collection Time: 02/06/22  7:11 PM  Result Value Ref Range   Blood Bank Specimen SAMPLE AVAILABLE FOR TESTING    Sample Expiration      02/09/2022,2359 Performed at [facility], [address] [place], [place] [address]   CBC     Status: Abnormal   Collection Time: 02/06/22  7:18 PM  Result Value Ref Range   WBC 6.2 4.0 - 10.5 K/uL   RBC 3.85 (L) 3.87 - 5.11 MIL/uL   Hemoglobin 8.3 (L) 12.0 - 15.0 g/dL   HCT 26.6 (L) 36.0 - 46.0 %   MCV 69.1 (L) 80.0 - 100.0 fL   MCH 21.6 (L) 26.0 - 34.0 pg   MCHC 31.2 30.0 - 36.0 g/dL   RDW 14.2 11.5 - 15.5 %   Platelets 115 (L) 150 - 400 K/uL   nRBC 0.0 0.0 - 0.2 %  Urine Drug Screen, Qualitative ([facility] only)     Status: Abnormal   Collection Time: 02/06/22  8:10 PM  Result Value Ref Range   Tricyclic, Ur Screen NONE DETECTED NONE DETECTED   Amphetamines, Ur Screen NONE DETECTED NONE DETECTED   MDMA (Ecstasy)Ur Screen NONE DETECTED NONE DETECTED   Cocaine Metabolite,Ur [place] POSITIVE (A) NONE DETECTED   Opiate, Ur Screen NONE DETECTED NONE DETECTED   Phencyclidine (PCP) Ur S NONE DETECTED NONE DETECTED   Cannabinoid 50 Ng, Ur [place] POSITIVE (A) NONE DETECTED   Barbiturates, Ur Screen NONE DETECTED NONE DETECTED   Benzodiazepine, Ur Scrn NONE DETECTED NONE DETECTED   Methadone Scn, Ur NONE DETECTED NONE DETECTED  Wet prep, genital     Status:  Abnormal   Collection Time: 02/06/22  8:38 PM  Result Value Ref Range   Yeast Wet Prep HPF POC NONE SEEN NONE SEEN   Trich, Wet Prep NONE SEEN NONE SEEN  Clue Cells Wet Prep HPF POC NONE SEEN NONE SEEN   WBC, Wet Prep HPF POC <10 (A) <10   Sperm NONE SEEN    Group B Strep positive  Assessment  [id number] at [redacted]w[redacted]d Estimated Date of Delivery: 02/18/22  RNST early labor SROM 2 hr, afebrile GBS pos UDS positive anemic  Patient Active Problem List   Diagnosis Date Noted   Uterine contractions 02/06/2022   Impaired glucose tolerance during pregnancy, antepartum 12/10/2021   Late prenatal care 11/19/2021   Substance use disorder 11/19/2021   Trichomoniasis 11/19/2021   Anemia of pregnancy in second trimester 11/19/2021   Pregnancy, supervision, high-risk 10/12/2021    Plan  1. Admit to L&D   2. EFM per unit policy 3. Labs : T&S, UDS, RPR 4. MD notified of patient admission and status  5. Expectant management  Maggie Font, CNM, FNP 02/07/2022 6:12 AM

## 2022-02-07 NOTE — Discharge Summary (Addendum)
OB Discharge Summary     Patient Name: Brianna Jarvis DOB: 1990/10/10 MRN: [id number]  Date of admission: 02/07/2022 Delivering MD: Maggie Font, CNM  Date of Delivery: 02/09/2022  Date of discharge: 02/09/22  Admitting diagnosis: Uterine contractions [O47.9] Intrauterine pregnancy: [redacted]w[redacted]d    Secondary diagnosis: Anemia Patient Active Problem List   Diagnosis Date Noted   Uterine contractions 02/06/2022   Impaired glucose tolerance during pregnancy, antepartum 12/10/2021   Late prenatal care 11/19/2021   Substance use disorder 11/19/2021   Trichomoniasis 11/19/2021   Anemia of pregnancy in second trimester 11/19/2021   Pregnancy, supervision, high-risk 10/12/2021    Discharge diagnosis: Term Pregnancy Delivered                                                                                                Post partum procedures: none  Augmentation:  none  Complications: None  Hospital course:  Onset of Labor With Vaginal Delivery      32y.o. yo [id number] admitted in Active Labor on 02/07/2022. Labor course was uncomplicated.  Membrane Rupture Time/Date: 4:30 AM ,02/07/2022   Delivery Method:Vaginal, Spontaneous  Episiotomy: None  Lacerations:  None  Patient had a postpartum course uncomplicated.  She is ambulating, tolerating a regular diet, passing flatus, and urinating well. Patient is discharged home in stable condition on 02/09/22.  Newborn Data: Birth date:02/07/2022  Birth time:8:14 AM  Gender:Female  Living status:Living  Apgars:7 ,8  Weight:3130 g   Subjective:   Physical exam  Vitals:   02/08/22 1912 02/08/22 2321 02/09/22 0258 02/09/22 0739  BP: (!) 129/99 130/87 (!) 128/97 125/89  Pulse: 78 72 66 (!) 57  Resp: 18 18 18 18  $ Temp: 97.8 F (36.6 C) 98.3 F (36.8 C) 98.4 F (36.9 C) 98.6 F (37 C)  TempSrc: Oral Oral Oral Oral  SpO2: 100% 100% 100% 98%  Weight:      Height:       General: alert, cooperative, and no distress Breast:  soft, non-tender, nipples without breakdown Lochia: appropriate Uterine Fundus: firm Perineum: no erythema or foul odor discharge, minimal edema DVT Evaluation: No evidence of DVT seen on physical exam. No cords or calf tenderness. No significant calf/ankle edema.  Labs: Lab Results  Component Value Date   WBC 8.8 02/08/2022   HGB 7.7 (L) 02/08/2022   HCT 24.6 (L) 02/08/2022   MCV 68.5 (L) 02/08/2022   PLT 113 (L) 02/08/2022    Discharge instruction: in After Visit Summary.  Medications:  Allergies as of 02/09/2022   No Known Allergies      Medication List     TAKE these medications    Fusion Plus Caps Take 1 tablet by mouth daily.         Activity: Advance as tolerated. Pelvic rest for 6 weeks.   Outpatient follow up:  [facility]   Specialty: Obstetrics and Gynecology Why: call to schedule at two week telehealth visit and six week in person visit Contact information: [address]  [place] [id number] [phone]                  Postpartum contraception: Nexplanon Rhogam Given postpartum: not indicated Rubella vaccine given postpartum: not indicated Varicella vaccine given postpartum: not indicated TDaP given antepartum or postpartum: given antenatally  Newborn Data: Live born female  Birth Weight:   APGAR: ,   Newborn Delivery   Birth date/time: 02/07/2022 08:14:00 Delivery type: Vaginal, Spontaneous       Baby Feeding: Bottle  Disposition:home with mother   Philip Aspen, CNM  02/09/2022 10:41 AM

## 2022-02-07 NOTE — Lactation Note (Signed)
This note was copied from a baby's chart. Lactation Consultation Note  Patient Name: Brianna Jarvis Today's Date: 02/07/2022   Age:34 hours  Mom unable to BF at this time. Positive cocaine in UDS. Mom has been made aware. Bottle feeding is going well.  Lavonia Drafts 02/07/2022, 4:40 PM

## 2022-02-07 NOTE — Progress Notes (Signed)
Bear hugger on patient at [phone]

## 2022-02-07 NOTE — Final Progress Note (Signed)
OB/Triage Note  Patient ID: Brianna Jarvis MRN: [id number] DOB/AGE: Jun 16, 1990 32 y.o.  Subjective  History of Present Illness:  32 y.o. [id number] @ [redacted]w[redacted]d by 21wk5d u/s Brianna Jarvis was late to care and has had limited prenatal with last PNV on 11/20/22 and only two visits total. Pregnancy is also complicated by anemia (although not taking oral Fe), failed 1hr GTT and yet to do 3hr GTT and substance abuse with UDS pos for cocaine and THC on 10/13., and positive trichomonis on 10/16, has not yet had TOC.    With limited prenatal care decided to repeat CBC which was significant for hgb of 8.3, discussed IV Fe, pt is amendable. Wet prep was neg, GBS positive, GC/CT neg, and UDS positive for cocaine and THC.      Patient presents with concerns about labor and SROM. Around 2pm while using the restroom she felt like fluid kept coming while sitting on the toliet after she felt she was done urinating. Has not had leaking since. Also reports contractions are are spaced apart. After being here for a little she feels the contractions have become less intense and spaced further. Endorses fetal movement. Denies VB.     Past Medical History:  Diagnosis Date   Heart murmur    Ovarian cyst     Past Surgical History:  Procedure Laterality Date   WISDOM TOOTH EXTRACTION     four; 2017    No current facility-administered medications on file prior to encounter.   Current Outpatient Medications on File Prior to Encounter  Medication Sig Dispense Refill   Iron-FA-B Cmp-C-Biot-Probiotic (FUSION PLUS) CAPS Take 1 tablet by mouth daily. (Patient not taking: Reported on 02/06/2022) 30 capsule 4   metroNIDAZOLE (FLAGYL) 500 MG tablet Take two tablets by mouth twice a day, for one day.  Or you can take all four tablets at once if you can tolerate it. Refill for expedited partner treatment (Patient not taking: Reported on 02/06/2022) 4 tablet 1   ondansetron (ZOFRAN) 4 MG tablet Take 1 tablet (4 mg total) by mouth  every 8 (eight) hours as needed for nausea or vomiting. (Patient not taking: Reported on 11/19/2021) 20 tablet 0   Prenatal Vit-Fe Fumarate-FA (MULTIVITAMIN-PRENATAL) 27-0.8 MG TABS tablet Take 1 tablet by mouth daily at 12 noon. (Patient not taking: Reported on 02/06/2022)      No Known Allergies  Social History   Socioeconomic History   Marital status: Significant Other    Spouse name: Kentavus   Number of children: 3   Years of education: 13   Highest education level: GED or equivalent  Occupational History   Occupation: Building surveyor service  Tobacco Use   Smoking status: Every Day    Packs/day: 0.25    Types: Cigarettes    Passive exposure: Current (FOB smokes)   Smokeless tobacco: Never  Vaping Use   Vaping Use: Never used  Substance and Sexual Activity   Alcohol use: Not Currently    Comment: red wine 2 weeks ago   Drug use: Not Currently    Types: Marijuana    Comment: occasional; last use 2 weeks ago   Sexual activity: Yes    Partners: Male    Birth control/protection: Surgical    Comment: hx depo and BCP  Other Topics Concern   Not on file  Social History Narrative   Not on file   Social Determinants of Health   Financial Resource Strain: Low Risk  (10/12/2021)   Overall Financial Resource  Strain (CARDIA)    Difficulty of Paying Living Expenses: Not very hard  Food Insecurity: Food Insecurity Present (10/12/2021)   Hunger Vital Sign    Worried About Running Out of Food in the Last Year: Sometimes true    Ran Out of Food in the Last Year: Sometimes true  Transportation Needs: No Transportation Needs (10/12/2021)   PRAPARE - Hydrologist (Medical): No    Lack of Transportation (Non-Medical): No  Physical Activity: Inactive (10/12/2021)   Exercise Vital Sign    Days of Exercise per Week: 0 days    Minutes of Exercise per Session: 0 min  Stress: No Stress Concern Present (10/12/2021)   [place]    Feeling of Stress : Not at all  Social Connections: Unknown (10/12/2021)   Social Connection and Isolation Panel [NHANES]    Frequency of Communication with Friends and Family: Not on file    Frequency of Social Gatherings with Friends and Family: Not on file    Attends Religious Services: Never    Active Member of [place] or Organizations: No    Attends Archivist Meetings: Never    Marital Status: Living with partner  Intimate Partner Violence: Not At Risk (10/12/2021)   Humiliation, Afraid, Rape, and Kick questionnaire    Fear of Current or Ex-Partner: No    Emotionally Abused: No    Physically Abused: No    Sexually Abused: No    Family History  Problem Relation Age of Onset   Diabetes Mother    Diabetes Father    Cancer Father 29       thyroid   Kidney failure Brother        as a baby   Stroke Maternal Grandmother    Diabetes Maternal Grandmother    Dementia Paternal Grandfather      ROS    Objective  Physical Exam: Ht 5\' 6"  (1.676 m)   Wt 85.3 kg   LMP 05/03/2021 (Approximate) Comment: period was light and only 3 days per pt  BMI 30.34 kg/m   OBGyn Exam  FHT 140, mod variability, pos accels, no decels Toco infrequent contractions  SVE 2cm, unchanged after IV iron  Significant Findings/ Diagnostic Studies:  Hgb 8.3 UDS positive for cocaine/THC GBS positive Wet prep neg Rom plus neg  Hospital Course: The patient was admitted to [facility] Triage for observation. ROM plus done to ensure membranes were intact. Patient's cervix was rechecked and did not change, was not in labor. Anemia was a secondary finding, was treated with IV iron and well tolerated. With inadequate prenatal care decided to collect GBS which was positive and TOC from earlier Trich dx which was negative. UDS remains positive for cocaine and THC, plan social work consult when admitted in labor.  Assessment: 32 y.o. female [phone] at [redacted]w[redacted]d  Not  in labor Membranes intact GBS positive Positive UDS anemia  Plan: Discharge home Follow up this with with AOB for ROB Labor precautions discussed  Discharge Instructions     Discharge diet:  No restrictions   Complete by: As directed    Discharge instructions   Complete by: As directed    Please schedule follow up visit for this week with [facility]   LABOR:  When conractions begin, you should start to time them from the beginning of one contraction to the beginning  of the next.  When contractions are 5 - 10 minutes apart  or less and have been regular for at least an hour, you should call your health care provider.   Complete by: As directed    No sexual activity restrictions   Complete by: As directed    Notify physician for bleeding from the vagina   Complete by: As directed    Notify physician for blurring of vision or spots before the eyes   Complete by: As directed    Notify physician for chills or fever   Complete by: As directed    Notify physician for fainting spells, "black outs" or loss of consciousness   Complete by: As directed    Notify physician for increase in vaginal discharge   Complete by: As directed    Notify physician for leaking of fluid   Complete by: As directed    Notify physician for pain or burning when urinating   Complete by: As directed    Notify physician for pelvic pressure (sudden increase)   Complete by: As directed    Notify physician for severe or continued nausea or vomiting   Complete by: As directed    Notify physician for sudden gushing of fluid from the vagina (with or without continued leaking)   Complete by: As directed    Notify physician for sudden, constant, or occasional abdominal pain   Complete by: As directed    Notify physician if baby moving less than usual   Complete by: As directed       Allergies as of 02/07/2022   No Known Allergies      Medication List     STOP taking these medications     metroNIDAZOLE 500 MG tablet Commonly known as: FLAGYL   multivitamin-prenatal 27-0.8 MG Tabs tablet   ondansetron 4 MG tablet Commonly known as: Zofran       TAKE these medications    Fusion Plus Caps Take 1 tablet by mouth daily.        Follow-up Information     [facility] Follow up.   Specialty: Obstetrics and Gynecology Why: please schedule a ROB appointment this week Contact information: [address] [place] [address] [phone]                Total time spent taking care of this patient: 60 minutes  Signed: Raeford Razor CNM, FNP 02/07/2022, 1:22 AM

## 2022-02-08 DIAGNOSIS — O99324 Drug use complicating childbirth: Secondary | ICD-10-CM

## 2022-02-08 DIAGNOSIS — O4202 Full-term premature rupture of membranes, onset of labor within 24 hours of rupture: Secondary | ICD-10-CM | POA: Diagnosis not present

## 2022-02-08 DIAGNOSIS — D649 Anemia, unspecified: Secondary | ICD-10-CM | POA: Diagnosis not present

## 2022-02-08 DIAGNOSIS — Z3A38 38 weeks gestation of pregnancy: Secondary | ICD-10-CM

## 2022-02-08 DIAGNOSIS — O9902 Anemia complicating childbirth: Secondary | ICD-10-CM | POA: Diagnosis not present

## 2022-02-08 DIAGNOSIS — O99820 Streptococcus B carrier state complicating pregnancy: Secondary | ICD-10-CM

## 2022-02-08 DIAGNOSIS — F1990 Other psychoactive substance use, unspecified, uncomplicated: Secondary | ICD-10-CM

## 2022-02-08 DIAGNOSIS — O0933 Supervision of pregnancy with insufficient antenatal care, third trimester: Secondary | ICD-10-CM | POA: Diagnosis not present

## 2022-02-08 LAB — CBC
HCT: 24.6 % — ABNORMAL LOW (ref 36.0–46.0)
Hemoglobin: 7.7 g/dL — ABNORMAL LOW (ref 12.0–15.0)
MCH: 21.4 pg — ABNORMAL LOW (ref 26.0–34.0)
MCHC: 31.3 g/dL (ref 30.0–36.0)
MCV: 68.5 fL — ABNORMAL LOW (ref 80.0–100.0)
Platelets: 113 10*3/uL — ABNORMAL LOW (ref 150–400)
RBC: 3.59 MIL/uL — ABNORMAL LOW (ref 3.87–5.11)
RDW: 14 % (ref 11.5–15.5)
WBC: 8.8 10*3/uL (ref 4.0–10.5)
nRBC: 0 % (ref 0.0–0.2)

## 2022-02-08 LAB — RPR: RPR Ser Ql: NONREACTIVE

## 2022-02-08 MED ORDER — RISAQUAD PO CAPS
1.0000 | ORAL_CAPSULE | Freq: Every day | ORAL | Status: DC
  Administered 2022-02-09: 1 via ORAL
  Filled 2022-02-08 (×2): qty 1

## 2022-02-08 NOTE — Clinical Social Work Maternal (Signed)
CLINICAL SOCIAL WORK MATERNAL/CHILD NOTE  Patient Details  Name: Kenosha Doster MRN: [id number] Date of Birth: 10/22/1990  Date:  02/08/2022  Clinical Social Worker Initiating Note:  Doran Clay RN BSN Case Manager Date/Time: Initiated:  02/08/22/1202     Child's Name:  Deri Fuelling   Biological Parents:  Mother, Father   Need for Interpreter:  None   Reason for Referral:  Current Substance Use/Substance Use During Pregnancy  , Behavioral Health Concerns   Address:  [place] [address]    Phone number:  [phone] (home)     Additional phone number: NA  Household Members/Support Persons (HM/SP):   Household Member/Support Person 1, Household Member/Support Person 2, Household Member/Support Person 3, Household Member/Support Person 4, Household Member/Support Person 5   HM/SP Name Relationship DOB or Age  HM/SP -1 Kantavus Barnes significant other 24  HM/SP -2 Latanya Maudlin Ex significant other- has temporary custody of her 3 kids    HM/SP -[place] son 5  HM/SP -[place] daughter 53  HM/SP -[place] son 5  HM/SP -6        HM/SP -7        HM/SP -8          Natural Supports (not living in the home):      Professional Supports:     Employment: Animator   Type of Work: [employer]   Education:  [place] arranged:    Museum/gallery curator Resources:  [employer]   Other Resources:  Physicist, medical  , [place] (going to apply for [employer] and [place])   Cultural/Religious Considerations Which May Impact Care:  NA  Strengths:  Ability to meet basic needs  , Home prepared for child  , Pediatrician chosen   Psychotropic Medications:         Pediatrician:    [employer]  Pediatrician List:   [facility] Other ([facility])  [facility]      Pediatrician Fax Number:    Risk Factors/Current Problems:  Substance Use  , Family/Relationship Issues  ,  Mental Health Concerns     Cognitive State:  Alert     Mood/Affect:  Interested  , Comfortable  , Happy     CSW Assessment: TOC consult received for positive UDS- cocaine ad marijuana.  RNCM met with mother of infant at the bedside, introduced self and explained reason for visit.  Mother of infant reports that she would use Cocaine maybe twice a month as a pick me up if she needed energy to work an extra shift at the [employer].  She was smoking marijuana daily for nausea during pregnancy, she reports that it helped her be able to eat.  Explained that a CPS report would be made, she did not get upset and verbalizes understanding.  Mother reports that CPS has been involved in the past with her other children but that there is no open case now. She and father of baby live with her ex boyfriend Latanya Maudlin.  She reports that [employer] has Temp custody of her other 3 children.  They all work at [employer], [employer] is currently providing transportation as their car is in the shop.    The home is prepared for the infant, bassinet, diapers, wipes, clothes, car seat.  She would like to use [facility] for Pediatrician, her other children go  there as well.    Mother of baby has a history of manic depression and bipolar, not currently on any medications, reports going to [facility] in [place] in the past.  RNCM will provide patient with resources for behavioral health so she can call and follow up with a provider.  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  Postpartum depression education will be added to her discharge paperwork.  CPS report given to Otila Kluver at [place] Plan/Description:  CSW Awaiting CPS Disposition Plan, Perinatal Mood and Anxiety Disorder (PMADs) Education, [place]    Shelbie Hutching, RN 02/08/2022, 12:11 PM

## 2022-02-08 NOTE — Discharge Instructions (Addendum)
      Other [place] for [facility] and [place] [phone] 24/7 Help Line [address] [place], [place] ?Individual Therapy ?Group Therapy ?Family and Couples Therapy ?Children's Therapy ?Dialectical and Behavioral Therapy Group ?Veterans' Therapy ?Life Skills Education ?Medication Management ?Bipolar, MDD, Postpartum, PTSD, ADHD, Anxiety, Addictions [place] [phone] [place], [address] ?Depression ?Anxiety ?ADHD ?Substance Abuse ?Bipolar ?Psychotherapy ?[place] and Ketamine Infusion Therapy ?Transcranial Magnetic Stimulation ([place]) Not accepting [facility] Not accepting [facility] [employer] [facility] Medicare Medicaid  [employer] [phone] [place], [address] ?Air cabin crew for Children and Youth with Mental Health and/or Intellectual and Developmental Diagnoses [place] [facility] [phone] [address] [place], [place] ?County Living and Support ?[employer] ?Day Supports ?Respite ?Supported Public affairs consultant [phone] [place], [place] ?Non-Hospital Detox ?Facility-Based Crisis Services ?Residential Services to Alcoholics and Addicts {Provides detox for men and women  and residential rehab for men only  ?Residential Services to Individuals with Mental Illness  Must make referral through [facility] and [place]. This is a Medical illustrator (CASP) therefore if the patient's financial resources are exhausted, monies may be available for services.

## 2022-02-08 NOTE — Progress Notes (Signed)
Subjective:  Doing well postpartum day 1; she is tolerating regular diet, her pain is controlled with PO medication, she is ambulating and voiding without difficulty.  Objective:  Vital signs in last 24 hours: Temp:  [97.6 F (36.4 C)-98.3 F (36.8 C)] 97.9 F (36.6 C) (01/30 1219) Pulse Rate:  [55-75] 75 (01/30 1219) Resp:  [18-20] 20 (01/30 1219) BP: (127-142)/(80-93) 132/89 (01/30 1219) SpO2:  [99 %-100 %] 100 % (01/30 1219)    General: NAD Pulmonary: no increased work of breathing Abdomen: non-distended, non-tender, fundus firm at level of umbilicus Extremities: no edema, no erythema, no tenderness  Results for orders placed or performed during the hospital encounter of 02/07/22 (from the past 72 hour(s))  Comprehensive metabolic panel     Status: Abnormal   Collection Time: 02/07/22  9:17 AM  Result Value Ref Range   Sodium 134 (L) 135 - 145 mmol/L   Potassium 3.6 3.5 - 5.1 mmol/L   Chloride 103 98 - 111 mmol/L   CO2 20 (L) 22 - 32 mmol/L   Glucose, Bld 115 (H) 70 - 99 mg/dL    Comment: Glucose reference range applies only to samples taken after fasting for at least 8 hours.   BUN <5 (L) 6 - 20 mg/dL   Creatinine, Ser 0.60 0.44 - 1.00 mg/dL   Calcium 8.9 8.9 - 10.3 mg/dL   Total Protein 7.0 6.5 - 8.1 g/dL   Albumin 3.5 3.5 - 5.0 g/dL   AST 41 15 - 41 U/L   ALT 22 0 - 44 U/L   Alkaline Phosphatase 149 (H) 38 - 126 U/L   Total Bilirubin 0.9 0.3 - 1.2 mg/dL   GFR, Estimated >60 >60 mL/min    Comment: (NOTE) Calculated using the CKD-EPI Creatinine Equation (2021)    Anion gap 11 5 - 15    Comment: Performed at [facility], [address] [place], [place] [address]  CBC with Differential/Platelet     Status: Abnormal   Collection Time: 02/07/22  9:17 AM  Result Value Ref Range   WBC 10.5 4.0 - 10.5 K/uL   RBC 4.41 3.87 - 5.11 MIL/uL   Hemoglobin 9.6 (L) 12.0 - 15.0 g/dL   HCT 30.8 (L) 36.0 - 46.0 %   MCV 69.8 (L) 80.0 - 100.0 fL   MCH 21.8 (L) 26.0  - 34.0 pg   MCHC 31.2 30.0 - 36.0 g/dL   RDW 14.1 11.5 - 15.5 %   Platelets 121 (L) 150 - 400 K/uL   nRBC 0.0 0.0 - 0.2 %   Neutrophils Relative % 86 %   Neutro Abs 9.1 (H) 1.7 - 7.7 K/uL   Lymphocytes Relative 9 %   Lymphs Abs 0.9 0.7 - 4.0 K/uL   Monocytes Relative 4 %   Monocytes Absolute 0.4 0.1 - 1.0 K/uL   Eosinophils Relative 0 %   Eosinophils Absolute 0.0 0.0 - 0.5 K/uL   Basophils Relative 0 %   Basophils Absolute 0.0 0.0 - 0.1 K/uL   Immature Granulocytes 1 %   Abs Immature Granulocytes 0.05 0.00 - 0.07 K/uL   Burr Cells PRESENT     Comment: Performed at [facility], [address] [place],  [address]  Type and screen [place]     Status: None   Collection Time: 02/07/22  9:22 AM  Result Value Ref Range   ABO/RH(D) A POS    Antibody Screen NEG    Sample Expiration  02/10/2022,2359 Performed at [facility], [address] [place], [place] [address]   RPR     Status: None   Collection Time: 02/07/22  9:22 AM  Result Value Ref Range   RPR Ser Ql NON REACTIVE NON REACTIVE    Comment: Performed at [facility] [address], [place], [place] [address]  Protein / creatinine ratio, urine     Status: Abnormal   Collection Time: 02/07/22  9:05 PM  Result Value Ref Range   Creatinine, Urine 91 mg/dL   Total Protein, Urine 124 mg/dL    Comment: NO NORMAL RANGE ESTABLISHED FOR THIS TEST   Protein Creatinine Ratio 1.36 (H) 0.00 - 0.15 mg/mg[Cre]    Comment: Performed at [facility], [address], [place], [place] [address]  Urine Drug Screen, Qualitative ([place] only)     Status: Abnormal   Collection Time: 02/07/22  9:50 PM  Result Value Ref Range   Tricyclic, Ur Screen NONE DETECTED NONE DETECTED   Amphetamines, Ur Screen NONE DETECTED NONE DETECTED   MDMA (Ecstasy)Ur Screen NONE DETECTED NONE DETECTED   Cocaine Metabolite,Ur [place] POSITIVE (A) NONE DETECTED   Opiate, Ur Screen NONE DETECTED NONE  DETECTED   Phencyclidine (PCP) Ur S NONE DETECTED NONE DETECTED   Cannabinoid 50 Ng, Ur [place] POSITIVE (A) NONE DETECTED   Barbiturates, Ur Screen NONE DETECTED NONE DETECTED   Benzodiazepine, Ur Scrn NONE DETECTED NONE DETECTED   Methadone Scn, Ur NONE DETECTED NONE DETECTED    Comment: (NOTE) Tricyclics + metabolites, urine    Cutoff 1000 ng/mL Amphetamines + metabolites, urine  Cutoff 1000 ng/mL MDMA (Ecstasy), urine              Cutoff 500 ng/mL Cocaine Metabolite, urine          Cutoff 300 ng/mL Opiate + metabolites, urine        Cutoff 300 ng/mL Phencyclidine (PCP), urine         Cutoff 25 ng/mL Cannabinoid, urine                 Cutoff 50 ng/mL Barbiturates + metabolites, urine  Cutoff 200 ng/mL Benzodiazepine, urine              Cutoff 200 ng/mL Methadone, urine                   Cutoff 300 ng/mL  The urine drug screen provides only a preliminary, unconfirmed analytical test result and should not be used for non-medical purposes. Clinical consideration and professional judgment should be applied to any positive drug screen result due to possible interfering substances. A more specific alternate chemical method must be used in order to obtain a confirmed analytical result. Gas chromatography / mass spectrometry (GC/MS) is the preferred confirm atory method. Performed at [facility], [address] [place], [place] [address]   CBC     Status: Abnormal   Collection Time: 02/08/22  6:21 AM  Result Value Ref Range   WBC 8.8 4.0 - 10.5 K/uL   RBC 3.59 (L) 3.87 - 5.11 MIL/uL   Hemoglobin 7.7 (L) 12.0 - 15.0 g/dL    Comment: Reticulocyte Hemoglobin testing may be clinically indicated, consider ordering this additional test [id number]    HCT 24.6 (L) 36.0 - 46.0 %   MCV 68.5 (L) 80.0 - 100.0 fL   MCH 21.4 (L) 26.0 - 34.0 pg   MCHC 31.3 30.0 - 36.0 g/dL   RDW 14.0 11.5 -  15.5 %   Platelets 113 (L) 150 - 400 K/uL    Comment: REPEATED TO VERIFY   nRBC 0.0 0.0 - 0.2 %     Comment: Performed at [facility], [address] [place], [place] [address]    Assessment:   32 y.o. [id number] postpartum day # 1  Plan:    1) Acute blood loss anemia - hemodynamically stable and asymptomatic - po ferrous sulfate  2) Blood Type --/--/A POS (01/29 5462) / Rubella 3.70 (10/03 1044) / Varicella Immune  3) TDAP status up to date  4) Feeding plan:  formula  5)  Education given regarding options for contraception, as well as compatibility with breast feeding if applicable.  Patient plans on Nexplanon for contraception.  6) Disposition: continue current care, social work consult ordered   Rod Can, [facility] 02/08/2022, 12:30 PM

## 2022-02-09 DIAGNOSIS — O9902 Anemia complicating childbirth: Secondary | ICD-10-CM | POA: Diagnosis not present

## 2022-02-09 DIAGNOSIS — F1990 Other psychoactive substance use, unspecified, uncomplicated: Secondary | ICD-10-CM

## 2022-02-09 DIAGNOSIS — O0933 Supervision of pregnancy with insufficient antenatal care, third trimester: Secondary | ICD-10-CM | POA: Diagnosis not present

## 2022-02-09 DIAGNOSIS — Z3A38 38 weeks gestation of pregnancy: Secondary | ICD-10-CM

## 2022-02-09 DIAGNOSIS — O99820 Streptococcus B carrier state complicating pregnancy: Secondary | ICD-10-CM

## 2022-02-09 DIAGNOSIS — O99324 Drug use complicating childbirth: Secondary | ICD-10-CM

## 2022-02-09 DIAGNOSIS — D649 Anemia, unspecified: Secondary | ICD-10-CM

## 2022-02-09 DIAGNOSIS — O4202 Full-term premature rupture of membranes, onset of labor within 24 hours of rupture: Secondary | ICD-10-CM | POA: Diagnosis not present

## 2022-02-09 MED ORDER — IBUPROFEN 600 MG PO TABS
600.0000 mg | ORAL_TABLET | Freq: Four times a day (QID) | ORAL | Status: DC
  Administered 2022-02-09: 600 mg via ORAL
  Filled 2022-02-09: qty 1

## 2022-02-09 NOTE — TOC Progression Note (Signed)
Transition of Care [facility]) - Progression Note    Patient Details  Name: Brianna Jarvis MRN: [id number] Date of Birth: 1990/03/03  Transition of Care [facility]) CM/SW Contact  Shelbie Hutching, RN Phone Number: 02/09/2022, 8:47 AM  Clinical Narrative:    Received a call from Cherre Robins with [place] [phone], she is coming up to see mom and baby today.         Expected Discharge Plan and Services                                               Social Determinants of Health (SDOH) Interventions [place]: Food Insecurity Present (10/12/2021)  Housing: Low Risk  (10/12/2021)  Transportation Needs: No Transportation Needs (10/12/2021)  Utilities: Not At Risk (10/12/2021)  Depression (PHQ2-9): Low Risk  (08/02/2021)  Financial Resource Strain: Low Risk  (10/12/2021)  Physical Activity: Inactive (10/12/2021)  Social Connections: Unknown (10/12/2021)  Stress: No Stress Concern Present (10/12/2021)  Tobacco Use: High Risk (02/07/2022)    Readmission Risk Interventions     No data to display

## 2022-02-09 NOTE — Final Progress Note (Addendum)
Final Progress Note  Patient ID: Brianna Jarvis MRN: [id number] DOB/AGE: January 08, 1991 32 y.o.  Admit date: 02/07/2022 Admitting provider: Maggie Font, CNM Discharge date: 02/09/2022   Admission Diagnoses: labor management  Uterine contractions 02/06/2022   Impaired glucose tolerance during pregnancy, antepartum 12/10/2021   Late prenatal care/ limited prenatal care  11/19/2021   Substance use disorder 11/19/2021   Trichomoniasis 11/19/2021   Anemia of pregnancy in second trimester 11/19/2021   Pregnancy, supervision, high-risk 10/12/2021    Discharge Diagnoses:  Principal Problem:   Uterine contractions    Uterine contractions 02/06/2022   Impaired glucose tolerance during pregnancy, antepartum 12/10/2021   Late prenatal care/ limited prenatal care 11/19/2021   Substance use disorder 11/19/2021   Trichomoniasis 11/19/2021   Anemia of pregnancy  11/19/2021   Pregnancy, supervision, high-risk 10/12/2021     Consults:  Social Worker  Significant Findings/ Diagnostic Studies: none  Procedures: none  Discharge Condition: good  Disposition: Discharge disposition: 01-Home or Self Care       Diet: Regular diet  Discharge Activity: No lifting, driving, or strenuous exercise for 2 weeks   Allergies as of 02/09/2022   No Known Allergies      Medication List     TAKE these medications    Fusion Plus Caps Take 1 tablet by mouth daily.        Follow-up [facility] at [facility] Follow up.   Specialty: Obstetrics and Gynecology Why: call to schedule at two week telehealth visit and six week in person visit Contact information: [address] [place] [address] [phone]                Total time spent taking care of this patient: 10 minutes  Signed: Philip Aspen 02/09/2022, 10:42 AM

## 2022-02-09 NOTE — Progress Notes (Signed)
Patient discharged home with family.  Discharge instructions, when to follow up, and prescriptions reviewed with patient.  Patient verbalized understanding. Patient will be escorted out by auxiliary.   

## 2022-02-10 ENCOUNTER — Telehealth: Payer: Self-pay | Admitting: Certified Nurse Midwife

## 2022-02-10 NOTE — Telephone Encounter (Signed)
I contacted patient via phone X2. Call couldn't be completed as dialed.

## 2022-02-10 NOTE — Telephone Encounter (Signed)
-----  Message from [place], [place] sent at 02/09/2022 10:43 AM EST ----- This pt needs 2 week video visit, 4 wk visit for nexplanon placement, 6 week ppv . With anyone of the midwives.   Thanks  Deneise Lever

## 2022-02-16 NOTE — Telephone Encounter (Signed)
I contacted patient via phone. I left message for patient to call back to be scheduled. 

## 2022-04-11 ENCOUNTER — Telehealth: Payer: Self-pay

## 2022-04-11 NOTE — Telephone Encounter (Signed)
[facility]- Discharge Call Backs-let pt a VM about the following below. 1-Do you have any questions or concerns about yourself as you heal? 2-Any concerns or questions about your baby? 3-How was your stay at the hospital? 4-How did our team work together to care for you? You should be receiving a survey in the mail soon.   We would really appreciate it if you could fill that out for [place] and return it in the mail.  We value the feedback to make improvements and continue the great work we do.   If you have any questions please feel free to call me back at [phone]

## 2022-12-13 ENCOUNTER — Observation Stay: Payer: Self-pay

## 2022-12-13 ENCOUNTER — Other Ambulatory Visit: Payer: Self-pay

## 2022-12-13 ENCOUNTER — Emergency Department: Payer: Self-pay

## 2022-12-13 ENCOUNTER — Observation Stay
Admission: EM | Admit: 2022-12-13 | Discharge: 2022-12-13 | Disposition: A | Discharge status: Home / Self Care | Payer: Self-pay | Attending: Obstetrics | Admitting: Obstetrics

## 2022-12-13 DIAGNOSIS — R42 Dizziness and giddiness: Secondary | ICD-10-CM | POA: Insufficient documentation

## 2022-12-13 DIAGNOSIS — R1030 Lower abdominal pain, unspecified: Secondary | ICD-10-CM | POA: Insufficient documentation

## 2022-12-13 DIAGNOSIS — O26893 Other specified pregnancy related conditions, third trimester: Secondary | ICD-10-CM | POA: Insufficient documentation

## 2022-12-13 DIAGNOSIS — O26853 Spotting complicating pregnancy, third trimester: Principal | ICD-10-CM | POA: Insufficient documentation

## 2022-12-13 DIAGNOSIS — Z3A28 28 weeks gestation of pregnancy: Secondary | ICD-10-CM | POA: Insufficient documentation

## 2022-12-13 DIAGNOSIS — O26899 Other specified pregnancy related conditions, unspecified trimester: Secondary | ICD-10-CM | POA: Diagnosis present

## 2022-12-13 DIAGNOSIS — O4690 Antepartum hemorrhage, unspecified, unspecified trimester: Principal | ICD-10-CM

## 2022-12-13 LAB — CBC
HCT: 30.2 % — ABNORMAL LOW (ref 36.0–46.0)
Hemoglobin: 9.1 g/dL — ABNORMAL LOW (ref 12.0–15.0)
MCH: 21.6 pg — ABNORMAL LOW (ref 26.0–34.0)
MCHC: 30.1 g/dL (ref 30.0–36.0)
MCV: 71.7 fL — ABNORMAL LOW (ref 80.0–100.0)
Platelets: 147 10*3/uL — ABNORMAL LOW (ref 150–400)
RBC: 4.21 MIL/uL (ref 3.87–5.11)
RDW: 14 % (ref 11.5–15.5)
WBC: 8.3 10*3/uL (ref 4.0–10.5)
nRBC: 0 % (ref 0.0–0.2)

## 2022-12-13 LAB — URINE DRUG SCREEN, QUALITATIVE (ARMC ONLY)
Amphetamines, Ur Screen: NOT DETECTED
Barbiturates, Ur Screen: NOT DETECTED
Benzodiazepine, Ur Scrn: NOT DETECTED
Cannabinoid 50 Ng, Ur ~~LOC~~: POSITIVE — AB
Cocaine Metabolite,Ur ~~LOC~~: POSITIVE — AB
MDMA (Ecstasy)Ur Screen: NOT DETECTED
Methadone Scn, Ur: NOT DETECTED
Opiate, Ur Screen: NOT DETECTED
Phencyclidine (PCP) Ur S: NOT DETECTED
Tricyclic, Ur Screen: NOT DETECTED

## 2022-12-13 LAB — COMPREHENSIVE METABOLIC PANEL
ALT: 10 U/L (ref 0–44)
AST: 16 U/L (ref 15–41)
Albumin: 3.4 g/dL — ABNORMAL LOW (ref 3.5–5.0)
Alkaline Phosphatase: 56 U/L (ref 38–126)
Anion gap: 9 (ref 5–15)
BUN: <5 mg/dL — ABNORMAL LOW (ref 6–20)
CO2: 18 mmol/L — ABNORMAL LOW (ref 22–32)
Calcium: 8.5 mg/dL — ABNORMAL LOW (ref 8.9–10.3)
Chloride: 106 mmol/L (ref 98–111)
Creatinine, Ser: 0.47 mg/dL (ref 0.44–1.00)
GFR, Estimated: >60 mL/min (ref >60)
Glucose, Bld: 89 mg/dL (ref 70–99)
Potassium: 3.8 mmol/L (ref 3.5–5.1)
Sodium: 133 mmol/L — ABNORMAL LOW (ref 135–145)
Total Bilirubin: 0.4 mg/dL (ref <1.2)
Total Protein: 6.9 g/dL (ref 6.5–8.1)

## 2022-12-13 LAB — URINALYSIS, ROUTINE W REFLEX MICROSCOPIC
Bilirubin Urine: NEGATIVE
Glucose, UA: NEGATIVE mg/dL
Hgb urine dipstick: NEGATIVE
Ketones, ur: NEGATIVE mg/dL
Leukocytes,Ua: NEGATIVE
Nitrite: NEGATIVE
Protein, ur: NEGATIVE mg/dL
Specific Gravity, Urine: 1.024 (ref 1.005–1.030)
pH: 6 (ref 5.0–8.0)

## 2022-12-13 LAB — WET PREP, GENITAL
Clue Cells Wet Prep HPF POC: NONE SEEN
Sperm: NONE SEEN
Trich, Wet Prep: NONE SEEN
WBC, Wet Prep HPF POC: >=10 — AB (ref <10)
Yeast Wet Prep HPF POC: NONE SEEN

## 2022-12-13 LAB — CBC WITH DIFFERENTIAL/PLATELET
Abs Immature Granulocytes: 0.07 10*3/uL (ref 0.00–0.07)
Basophils Absolute: 0 10*3/uL (ref 0.0–0.1)
Basophils Relative: 0 %
Eosinophils Absolute: 0.2 10*3/uL (ref 0.0–0.5)
Eosinophils Relative: 2 %
HCT: 29.7 % — ABNORMAL LOW (ref 36.0–46.0)
Hemoglobin: 9.2 g/dL — ABNORMAL LOW (ref 12.0–15.0)
Immature Granulocytes: 1 %
Lymphocytes Relative: 25 %
Lymphs Abs: 2.1 10*3/uL (ref 0.7–4.0)
MCH: 22.2 pg — ABNORMAL LOW (ref 26.0–34.0)
MCHC: 31 g/dL (ref 30.0–36.0)
MCV: 71.7 fL — ABNORMAL LOW (ref 80.0–100.0)
Monocytes Absolute: 0.7 10*3/uL (ref 0.1–1.0)
Monocytes Relative: 9 %
Neutro Abs: 5.2 10*3/uL (ref 1.7–7.7)
Neutrophils Relative %: 63 %
Platelets: 150 10*3/uL (ref 150–400)
RBC: 4.14 MIL/uL (ref 3.87–5.11)
RDW: 13.9 % (ref 11.5–15.5)
WBC: 8.3 10*3/uL (ref 4.0–10.5)
nRBC: 0 % (ref 0.0–0.2)

## 2022-12-13 LAB — RAPID HIV SCREEN (HIV 1/2 AB+AG)
HIV 1/2 Antibodies: NONREACTIVE
HIV-1 P24 Antigen - HIV24: NONREACTIVE

## 2022-12-13 LAB — CHLAMYDIA/NGC RT PCR (ARMC ONLY)
Chlamydia Tr: NOT DETECTED
N gonorrhoeae: NOT DETECTED

## 2022-12-13 LAB — HEPATITIS B SURFACE ANTIGEN: Hepatitis B Surface Ag: NONREACTIVE

## 2022-12-13 LAB — GROUP B STREP BY PCR: Group B strep by PCR: NEGATIVE

## 2022-12-13 LAB — ABO/RH: ABO/RH(D): A POS

## 2022-12-13 MED ORDER — DOCUSATE SODIUM 100 MG PO CAPS: 100.0000 mg | ORAL_CAPSULE | Freq: Every day | ORAL | Status: DC

## 2022-12-13 MED ORDER — ZOLPIDEM TARTRATE 5 MG PO TABS: 5.0000 mg | ORAL_TABLET | Freq: Every evening | ORAL | Status: DC | PRN

## 2022-12-13 MED ORDER — IRON SUCROSE 300 MG IVPB - SIMPLE MED
300.0000 mg | Freq: Once | Status: AC
  Administered 2022-12-13: 300 mg via INTRAVENOUS
  Filled 2022-12-13: qty 300

## 2022-12-13 MED ORDER — SODIUM CHLORIDE 0.9 % IV SOLN: INTRAVENOUS | Status: DC | PRN

## 2022-12-13 MED ORDER — PRENATAL MULTIVITAMIN CH: 1.0000 | ORAL_TABLET | Freq: Every day | ORAL | Status: DC

## 2022-12-13 MED ORDER — CALCIUM CARBONATE ANTACID 500 MG PO CHEW: 2.0000 | CHEWABLE_TABLET | ORAL | Status: DC | PRN

## 2022-12-13 MED ORDER — ACETAMINOPHEN 325 MG PO TABS: 650.0000 mg | ORAL_TABLET | ORAL | Status: DC | PRN

## 2022-12-13 NOTE — Discharge Summary (Signed)
Brianna Jarvis is a 32 y.o. female. She is at Unknown gestation. Patient's last menstrual period was 05/02/2022 (approximate). Estimated Date of Delivery: None noted.  Prenatal care site:  none currently, plans to receive care with [facility]  Chief complaint: pink tinged vaginal discharge, and abdominal cramping  HPI: Brianna Jarvis presents to L&D with complaints of pink tinged vaginal discharge and mild cramping. 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  Factors complicating pregnancy: Cocaine use in pregnancy Marijuana use in pregnancy No prenatal care Uncertain gestational age of current pregnancy   S: Resting comfortably. no CTX, no VB.no LOF,  Active fetal movement.   Maternal Medical History:  Past Medical Hx:  has a past medical history of Heart murmur and Ovarian cyst.    Past Surgical Hx:  has a past surgical history that includes Wisdom tooth extraction.   No Known Allergies   Prior to Admission medications   Medication Sig Start Date End Date Taking? Authorizing Provider  Iron-FA-B Cmp-C-Biot-Probiotic (FUSION PLUS) CAPS Take 1 tablet by mouth daily. Patient not taking: Reported on 02/06/2022 11/19/21   Hildred Laser, MD    Social History: She  reports that she has been smoking cigarettes. She has been exposed to tobacco smoke. She has never used smokeless tobacco. She reports that she does not currently use alcohol. She reports that she does not currently use drugs after having used the following drugs: Marijuana.  Family History: family history includes Cancer (age of onset: 44) in her father; Dementia in her paternal grandfather; Diabetes in her father, maternal grandmother, and mother; Kidney failure in her brother; Stroke in her maternal grandmother. ,no history of gyn cancers  Review of Systems: A full review of systems was performed and negative  except as noted in the HPI.    O:  BP 115/74 (BP Location: Left Arm)   Pulse 75   Temp 98.1 F (36.7 C) (Oral)   Resp 16   Ht 5\' 6"  (1.676 m)   Wt 100.2 kg   LMP 05/02/2022 (Approximate)   SpO2 100%   Breastfeeding No   BMI 35.67 kg/m  Results for orders placed or performed during the hospital encounter of 12/13/22 (from the past 48 hour(s))  CBC with Differential/Platelet   Collection Time: 12/13/22  1:32 PM  Result Value Ref Range   WBC 8.3 4.0 - 10.5 K/uL   RBC 4.14 3.87 - 5.11 MIL/uL   Hemoglobin 9.2 (L) 12.0 - 15.0 g/dL   HCT 69.6 (L) 29.5 - 28.4 %   MCV 71.7 (L) 80.0 - 100.0 fL   MCH 22.2 (L) 26.0 - 34.0 pg   MCHC 31.0 30.0 - 36.0 g/dL   RDW 13.2 44.0 - 10.2 %   Platelets 150 150 - 400 K/uL   nRBC 0.0 0.0 - 0.2 %   Neutrophils Relative % 63 %   Neutro Abs 5.2 1.7 - 7.7 K/uL   Lymphocytes Relative 25 %   Lymphs Abs 2.1 0.7 - 4.0 K/uL   Monocytes Relative 9 %   Monocytes Absolute 0.7 0.1 - 1.0 K/uL   Eosinophils Relative 2 %   Eosinophils Absolute 0.2 0.0 - 0.5 K/uL   Basophils Relative 0 %   Basophils Absolute 0.0 0.0 - 0.1 K/uL   Immature Granulocytes 1 %   Abs Immature Granulocytes 0.07 0.00 - 0.07 K/uL  ABO/Rh  Collection Time: 12/13/22  1:32 PM  Result Value Ref Range   ABO/RH(D)      A POS Performed at [facility], [address] [place], [place] [address]   CBC   Collection Time: 12/13/22  1:33 PM  Result Value Ref Range   WBC 8.3 4.0 - 10.5 K/uL   RBC 4.21 3.87 - 5.11 MIL/uL   Hemoglobin 9.1 (L) 12.0 - 15.0 g/dL   HCT 44.0 (L) 34.7 - 42.5 %   MCV 71.7 (L) 80.0 - 100.0 fL   MCH 21.6 (L) 26.0 - 34.0 pg   MCHC 30.1 30.0 - 36.0 g/dL   RDW 95.6 38.7 - 56.4 %   Platelets 147 (L) 150 - 400 K/uL   nRBC 0.0 0.0 - 0.2 %  Comprehensive metabolic panel   Collection Time: 12/13/22  1:33 PM  Result Value Ref Range   Sodium 133 (L) 135 - 145 mmol/L   Potassium 3.8 3.5 - 5.1 mmol/L   Chloride 106 98 - 111 mmol/L   CO2 18 (L) 22 - 32 mmol/L    Glucose, Bld 89 70 - 99 mg/dL   BUN <5 (L) 6 - 20 mg/dL   Creatinine, Ser 3.32 0.44 - 1.00 mg/dL   Calcium 8.5 (L) 8.9 - 10.3 mg/dL   Total Protein 6.9 6.5 - 8.1 g/dL   Albumin 3.4 (L) 3.5 - 5.0 g/dL   AST 16 15 - 41 U/L   ALT 10 0 - 44 U/L   Alkaline Phosphatase 56 38 - 126 U/L   Total Bilirubin 0.4 <1.2 mg/dL   GFR, Estimated >95 >18 mL/min   Anion gap 9 5 - 15  Wet prep, genital   Collection Time: 12/13/22  3:13 PM   Specimen: Cervix  Result Value Ref Range   Yeast Wet Prep HPF POC NONE SEEN NONE SEEN   Trich, Wet Prep NONE SEEN NONE SEEN   Clue Cells Wet Prep HPF POC NONE SEEN NONE SEEN   WBC, Wet Prep HPF POC >=10 (A) <10   Sperm NONE SEEN   Chlamydia/NGC rt PCR ([facility] only)   Collection Time: 12/13/22  3:13 PM   Specimen: Cervix  Result Value Ref Range   Specimen source GC/Chlam ENDOCERVICAL    Chlamydia Tr NOT DETECTED NOT DETECTED   N gonorrhoeae NOT DETECTED NOT DETECTED  Group B strep by PCR   Collection Time: 12/13/22  3:13 PM   Specimen: Cervix; Genital  Result Value Ref Range   Group B strep by PCR PRESUMPTIVE NEGATIVE PRESUMPTIVE NEGATIVE  Urine Drug Screen, Qualitative ([facility] only)   Collection Time: 12/13/22  3:13 PM  Result Value Ref Range   Tricyclic, Ur Screen NONE DETECTED NONE DETECTED   Amphetamines, Ur Screen NONE DETECTED NONE DETECTED   MDMA (Ecstasy)Ur Screen NONE DETECTED NONE DETECTED   Cocaine Metabolite,Ur [place] POSITIVE (A) NONE DETECTED   Opiate, Ur Screen NONE DETECTED NONE DETECTED   Phencyclidine (PCP) Ur S NONE DETECTED NONE DETECTED   Cannabinoid 50 Ng, Ur [place] POSITIVE (A) NONE DETECTED   Barbiturates, Ur Screen NONE DETECTED NONE DETECTED   Benzodiazepine, Ur Scrn NONE DETECTED NONE DETECTED   Methadone Scn, Ur NONE DETECTED NONE DETECTED  Urinalysis, Routine w reflex microscopic -Urine, Clean Catch   Collection Time: 12/13/22  3:13 PM  Result Value Ref Range   Color, Urine YELLOW (A) YELLOW   APPearance CLEAR (A) CLEAR   Specific  Gravity, Urine 1.024 1.005 - 1.030   pH 6.0 5.0 -  8.0   Glucose, UA NEGATIVE NEGATIVE mg/dL   Hgb urine dipstick NEGATIVE NEGATIVE   Bilirubin Urine NEGATIVE NEGATIVE   Ketones, ur NEGATIVE NEGATIVE mg/dL   Protein, ur NEGATIVE NEGATIVE mg/dL   Nitrite NEGATIVE NEGATIVE   Leukocytes,Ua NEGATIVE NEGATIVE  Rapid HIV screen (HIV 1/2 Ab+Ag)   Collection Time: 12/13/22  3:20 PM  Result Value Ref Range   HIV-1 P24 Antigen - HIV24 NON REACTIVE NON REACTIVE   HIV 1/2 Antibodies NON REACTIVE NON REACTIVE   Interpretation (HIV Ag Ab)      A non reactive test result means that HIV 1 or HIV 2 antibodies and HIV 1 p24 antigen were not detected in the specimen.     Constitutional: NAD, AAOx3  HE/ENT: extraocular movements grossly intact, moist mucous membranes CV: RRR PULM: nl respiratory effort, CTABL Abd: gravid, non-tender, non-distended, soft  Ext: Non-tender, Nonedmeatous Psych: mood appropriate, speech normal Pelvic : deferred SVE:       FHR: 130 beats/min   Assessment: 32 y.o. Unknown here for antenatal surveillance during pregnancy.  Principle diagnosis:  The encounter diagnosis was Vaginal bleeding in pregnancy.   Plan: Labor: not present.  Fetal Wellbeing: Reassuring per comp OB US Reactive NST  D/c home stable, precautions reviewed, follow-up as scheduled.   ----- Chari Manning, CNM Certified Nurse Midwife [facility]

## 2022-12-13 NOTE — Progress Notes (Signed)
Pt presents to L/D triage with reported pink-tinged discharged and mild cramping. Pt reports no noted bleeding or LOF and no pain in L/D.  Pt has received no prenatal care this pregnancy. She estimates she is between 7 and 8 months pregnant. Monitors applied and assessing. Initial FHT 130.Baby difficult to trace- CNM notified.  VSS.  Pt reports positive fetal movement.  Swabs/labs collected.  Pt to receive complete US.

## 2022-12-13 NOTE — ED Provider Notes (Signed)
   [facility] Provider Note    Event Date/Time   First MD Initiated Contact with Patient 12/13/22 1315     (approximate)  History   Chief Complaint: Vaginal Bleeding, Abdominal Pain, and Dizziness  HPI  Brianna Jarvis is a 32 y.o. female G7, P4 approximately 7 or 8 months pregnant per patient who presents to the emergency department for vaginal spotting.  According to the patient today she has noticed some "pressure" to the lower abdomen went to use the restroom and had some mild vaginal spotting so she came to the emergency department.  Patient is approximately 7 to 8 months pregnant per patient but has not had any OB care throughout this pregnancy.  Patient denies any abdominal pain.  Denies any contractions.  No fluid leakage.  Physical Exam   Triage Vital Signs: ED Triage Vitals  Encounter Vitals Group     BP      Systolic BP Percentile      Diastolic BP Percentile      Pulse      Resp      Temp      Temp src      SpO2      Weight      Height      Head Circumference      Peak Flow      Pain Score      Pain Loc      Pain Education      Exclude from Growth Chart     Most recent vital signs: There were no vitals filed for this visit.  General: Awake, no distress.  CV:  Good peripheral perfusion.  Regular rate and rhythm  Resp:  Normal effort.  Equal breath sounds bilaterally.  Abd:  No distention.  Gravid abdomen.  ED Results / Procedures / Treatments   MEDICATIONS ORDERED IN ED: Medications - No data to display   IMPRESSION / MDM / ASSESSMENT AND PLAN / ED COURSE  I reviewed the triage vital signs and the nursing notes.  Patient's presentation is most consistent with acute presentation with potential threat to life or bodily function.  Patient presents to the emergency department for vaginal spotting approximately 7 or 8 months pregnant denies any contraction type pain.  I performed a bedside ultrasound as the patient has not had any  OB care on femur length estimates range between 31 and 36 weeks.  We will obtain an OB ultrasound labs including ABO Rh and discussed with labor and delivery for further evaluation.  Patient agreeable to plan.  Patient's lab work is reassuring showing a normal CBC besides chronic anemia largely unchanged from baseline, chemistry reassuring.  ABO Rh is pending.  I spoke to the OB/GYN midwife she is agreeable to take the patient upstairs for further evaluation.  We will send to labor and delivery.  FINAL CLINICAL IMPRESSION(S) / ED DIAGNOSES   Vaginal bleeding during pregnancy Threatened miscarriage   Note:  This document was prepared using Dragon voice recognition software and may include unintentional dictation errors.   Minna Antis, MD 12/13/22 1410

## 2022-12-13 NOTE — Progress Notes (Signed)
Pt asking what she is waiting for. Explained I just called [place] and we are waiting for that final result.

## 2022-12-13 NOTE — Progress Notes (Signed)
Pt left AMA . IV removed, pt encouraged to keep scheduled appointment on WED with [facility]

## 2022-12-13 NOTE — Discharge Instructions (Signed)
Keep scheduled appointment with [facility]

## 2022-12-13 NOTE — ED Triage Notes (Signed)
Pt arrives via [facility] from work at [employer] with c/o vaginal bleeding, lower abdominal pain, spotting, dizziness. Pt is approx 25mo pregnant with no prenatal care with this pregnancy. Pt is G7P4, and her last 3 pregnancies were high risk. 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 Pt is normally seen by Salomon Mast and was to have their first prenatal appt next week. The lower abdomen pain, that pt states "feels more like pressure" started last night around 1100 but got more intense this morning 0930. The pt states that the spotting started around 1300, and is a light pink color and maybe a dime size. Pt has been experiencing the dizziness for about a week, and states that it happens when they stand and sometimes when they are moving around. Pt is A&Ox4.

## 2022-12-13 NOTE — Progress Notes (Signed)
No final results from [place]. Pt aware they are still pending.

## 2022-12-14 ENCOUNTER — Telehealth: Payer: Self-pay | Admitting: Certified Nurse Midwife

## 2022-12-14 LAB — RPR: RPR Ser Ql: NONREACTIVE

## 2022-12-14 LAB — VARICELLA ZOSTER ANTIBODY, IGG: Varicella IgG: REACTIVE

## 2022-12-14 LAB — RUBELLA SCREEN: Rubella: 2.94 {index} (ref >0.99)

## 2022-12-14 NOTE — Telephone Encounter (Signed)
Patient needs NOB intake, unable to reach the patient by phone x2. Voicemail is not set up.The patient was seen at ER and per Pattricia Boss the patient needs to be scheduled for [facility] intake. Offering visit for Wednesday, 12/11 at 1:15.

## 2022-12-14 NOTE — Discharge Summary (Incomplete)
Brianna Jarvis is a 32 y.o. female. She is at Unknown gestation. Patient's last menstrual period was 05/02/2022 (approximate). Estimated Date of Delivery: None noted.  Prenatal care site:  none currently, plans to receive care with [facility]  Chief complaint: pink tinged vaginal discharge, and abdominal cramping  HPI: Brianna Jarvis presents to L&D with complaints of pink tinged vaginal discharge and mild cramping. 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  Factors complicating pregnancy: Cocaine use in pregnancy Marijuana use in pregnancy No prenatal care Uncertain gestational age of current pregnancy   S: Resting comfortably. no CTX, no VB.no LOF,  Active fetal movement.   Maternal Medical History:  Past Medical Hx:  has a past medical history of Heart murmur and Ovarian cyst.    Past Surgical Hx:  has a past surgical history that includes Wisdom tooth extraction.   No Known Allergies   Prior to Admission medications   Medication Sig Start Date End Date Taking? Authorizing Provider  Iron-FA-B Cmp-C-Biot-Probiotic (FUSION PLUS) CAPS Take 1 tablet by mouth daily. Patient not taking: Reported on 02/06/2022 11/19/21   Hildred Laser, MD    Social History: She  reports that she has been smoking cigarettes. She has been exposed to tobacco smoke. She has never used smokeless tobacco. She reports that she does not currently use alcohol. She reports that she does not currently use drugs after having used the following drugs: Marijuana.  Family History: family history includes Cancer (age of onset: 44) in her father; Dementia in her paternal grandfather; Diabetes in her father, maternal grandmother, and mother; Kidney failure in her brother; Stroke in her maternal grandmother. ,no history of gyn cancers  Review of Systems: A full review of systems was performed and negative  except as noted in the HPI.    O:  BP 115/74 (BP Location: Left Arm)   Pulse 75   Temp 98.1 F (36.7 C) (Oral)   Resp 16   Ht 5\' 6"  (1.676 m)   Wt 100.2 kg   LMP 05/02/2022 (Approximate)   SpO2 100%   Breastfeeding No   BMI 35.67 kg/m  Results for orders placed or performed during the hospital encounter of 12/13/22 (from the past 48 hour(s))  CBC with Differential/Platelet   Collection Time: 12/13/22  1:32 PM  Result Value Ref Range   WBC 8.3 4.0 - 10.5 K/uL   RBC 4.14 3.87 - 5.11 MIL/uL   Hemoglobin 9.2 (L) 12.0 - 15.0 g/dL   HCT 69.6 (L) 29.5 - 28.4 %   MCV 71.7 (L) 80.0 - 100.0 fL   MCH 22.2 (L) 26.0 - 34.0 pg   MCHC 31.0 30.0 - 36.0 g/dL   RDW 13.2 44.0 - 10.2 %   Platelets 150 150 - 400 K/uL   nRBC 0.0 0.0 - 0.2 %   Neutrophils Relative % 63 %   Neutro Abs 5.2 1.7 - 7.7 K/uL   Lymphocytes Relative 25 %   Lymphs Abs 2.1 0.7 - 4.0 K/uL   Monocytes Relative 9 %   Monocytes Absolute 0.7 0.1 - 1.0 K/uL   Eosinophils Relative 2 %   Eosinophils Absolute 0.2 0.0 - 0.5 K/uL   Basophils Relative 0 %   Basophils Absolute 0.0 0.0 - 0.1 K/uL   Immature Granulocytes 1 %   Abs Immature Granulocytes 0.07 0.00 - 0.07 K/uL  ABO/Rh  Collection Time: 12/13/22  1:32 PM  Result Value Ref Range   ABO/RH(D)      A POS Performed at [facility], [address] [place], [place] [address]   CBC   Collection Time: 12/13/22  1:33 PM  Result Value Ref Range   WBC 8.3 4.0 - 10.5 K/uL   RBC 4.21 3.87 - 5.11 MIL/uL   Hemoglobin 9.1 (L) 12.0 - 15.0 g/dL   HCT 44.0 (L) 34.7 - 42.5 %   MCV 71.7 (L) 80.0 - 100.0 fL   MCH 21.6 (L) 26.0 - 34.0 pg   MCHC 30.1 30.0 - 36.0 g/dL   RDW 95.6 38.7 - 56.4 %   Platelets 147 (L) 150 - 400 K/uL   nRBC 0.0 0.0 - 0.2 %  Comprehensive metabolic panel   Collection Time: 12/13/22  1:33 PM  Result Value Ref Range   Sodium 133 (L) 135 - 145 mmol/L   Potassium 3.8 3.5 - 5.1 mmol/L   Chloride 106 98 - 111 mmol/L   CO2 18 (L) 22 - 32 mmol/L    Glucose, Bld 89 70 - 99 mg/dL   BUN <5 (L) 6 - 20 mg/dL   Creatinine, Ser 3.32 0.44 - 1.00 mg/dL   Calcium 8.5 (L) 8.9 - 10.3 mg/dL   Total Protein 6.9 6.5 - 8.1 g/dL   Albumin 3.4 (L) 3.5 - 5.0 g/dL   AST 16 15 - 41 U/L   ALT 10 0 - 44 U/L   Alkaline Phosphatase 56 38 - 126 U/L   Total Bilirubin 0.4 <1.2 mg/dL   GFR, Estimated >95 >18 mL/min   Anion gap 9 5 - 15  Wet prep, genital   Collection Time: 12/13/22  3:13 PM   Specimen: Cervix  Result Value Ref Range   Yeast Wet Prep HPF POC NONE SEEN NONE SEEN   Trich, Wet Prep NONE SEEN NONE SEEN   Clue Cells Wet Prep HPF POC NONE SEEN NONE SEEN   WBC, Wet Prep HPF POC >=10 (A) <10   Sperm NONE SEEN   Chlamydia/NGC rt PCR ([facility] only)   Collection Time: 12/13/22  3:13 PM   Specimen: Cervix  Result Value Ref Range   Specimen source GC/Chlam ENDOCERVICAL    Chlamydia Tr NOT DETECTED NOT DETECTED   N gonorrhoeae NOT DETECTED NOT DETECTED  Group B strep by PCR   Collection Time: 12/13/22  3:13 PM   Specimen: Cervix; Genital  Result Value Ref Range   Group B strep by PCR PRESUMPTIVE NEGATIVE PRESUMPTIVE NEGATIVE  Urine Drug Screen, Qualitative ([facility] only)   Collection Time: 12/13/22  3:13 PM  Result Value Ref Range   Tricyclic, Ur Screen NONE DETECTED NONE DETECTED   Amphetamines, Ur Screen NONE DETECTED NONE DETECTED   MDMA (Ecstasy)Ur Screen NONE DETECTED NONE DETECTED   Cocaine Metabolite,Ur [place] POSITIVE (A) NONE DETECTED   Opiate, Ur Screen NONE DETECTED NONE DETECTED   Phencyclidine (PCP) Ur S NONE DETECTED NONE DETECTED   Cannabinoid 50 Ng, Ur [place] POSITIVE (A) NONE DETECTED   Barbiturates, Ur Screen NONE DETECTED NONE DETECTED   Benzodiazepine, Ur Scrn NONE DETECTED NONE DETECTED   Methadone Scn, Ur NONE DETECTED NONE DETECTED  Urinalysis, Routine w reflex microscopic -Urine, Clean Catch   Collection Time: 12/13/22  3:13 PM  Result Value Ref Range   Color, Urine YELLOW (A) YELLOW   APPearance CLEAR (A) CLEAR   Specific  Gravity, Urine 1.024 1.005 - 1.030   pH 6.0 5.0 -  8.0   Glucose, UA NEGATIVE NEGATIVE mg/dL   Hgb urine dipstick NEGATIVE NEGATIVE   Bilirubin Urine NEGATIVE NEGATIVE   Ketones, ur NEGATIVE NEGATIVE mg/dL   Protein, ur NEGATIVE NEGATIVE mg/dL   Nitrite NEGATIVE NEGATIVE   Leukocytes,Ua NEGATIVE NEGATIVE  Rapid HIV screen (HIV 1/2 Ab+Ag)   Collection Time: 12/13/22  3:20 PM  Result Value Ref Range   HIV-1 P24 Antigen - HIV24 NON REACTIVE NON REACTIVE   HIV 1/2 Antibodies NON REACTIVE NON REACTIVE   Interpretation (HIV Ag Ab)      A non reactive test result means that HIV 1 or HIV 2 antibodies and HIV 1 p24 antigen were not detected in the specimen.     Constitutional: NAD, AAOx3  HE/ENT: extraocular movements grossly intact, moist mucous membranes CV: RRR PULM: nl respiratory effort, CTABL Abd: gravid, non-tender, non-distended, soft  Ext: Non-tender, Nonedmeatous Psych: mood appropriate, speech normal Pelvic : deferred SVE:       FHR: 130 beats/min   Assessment: 32 y.o. Unknown here for antenatal surveillance during pregnancy.  Principle diagnosis:  The encounter diagnosis was Vaginal bleeding in pregnancy.   Plan: Labor: not present.  Fetal Wellbeing: Reassuring per comp OB US Reactive NST  D/c home stable, precautions reviewed, follow-up as scheduled.   ----- Chari Manning, CNM Certified Nurse Midwife [facility]

## 2022-12-20 ENCOUNTER — Ambulatory Visit: Payer: Self-pay

## 2022-12-20 NOTE — Telephone Encounter (Signed)
I contacted the patient via phone. Someone answered, when I ask to speak with Brianna Jarvis the call was disconnected. The phone number on file is the only contact number on file. Unable to reach patient to confirm appointment or scheduled future needed appointments.

## 2022-12-21 ENCOUNTER — Telehealth: Payer: Self-pay | Admitting: Obstetrics and Gynecology

## 2022-12-21 ENCOUNTER — Telehealth: Payer: Self-pay

## 2022-12-21 NOTE — Telephone Encounter (Signed)
Reached out to pt to reschedule NOB Nurse Intake appt that was scheduled on 12/21/2022 at 1:15.  Could not leave a message bc mailbox was not set up.

## 2022-12-22 ENCOUNTER — Encounter: Payer: Self-pay | Admitting: Obstetrics and Gynecology

## 2022-12-22 NOTE — Telephone Encounter (Signed)
Reached out to pt (2x) to reschedule NOB nurse intake appt that was scheduled on 12/21/2022 at 1:15.  Called the first time and the phone was picked up, but no one said anything.  Called a second time and I could not leave a message.  Will send a MyChart message.

## 2023-01-11 NOTE — L&D Delivery Note (Signed)
 Delivery Note   Brianna Jarvis is a 33 y.o. [id number] at [redacted]w[redacted]d Estimated Date of Delivery: 03/27/23  PRE-OPERATIVE DIAGNOSIS:  1) [redacted]w[redacted]d pregnancy.  2) Scant prenatal care 3) Polysubstance use in pregnancy 4) elevated blood pressure  POST-OPERATIVE DIAGNOSIS:  1) [redacted]w[redacted]d pregnancy s/p Vaginal, Spontaneous And above  Delivery Type: Vaginal, Spontaneous   Delivery Anesthesia: None  Labor Complications:  none    ESTIMATED BLOOD LOSS: 200 ml    FINDINGS:   1) female infant, Apgar scores of 8   at 1 minute and    at 5 minutes and a birthweight of 103.35 ounces.     SPECIMENS:   PLACENTA:   Appearance: Intact   Removal: Spontaneous     Disposition:  discarded  CORD BLOOD: n/a  DISPOSITION:  Infant left in stable condition in the delivery room, with L&D personnel and mother,  NARRATIVE SUMMARY: Labor course:  Brianna Jarvis is a [id number] at [redacted]w[redacted]d who presented to Labor & Delivery for labor management. Her initial cervical exam was 5/90/-2. Labor proceeded with AROM. CNM was called to the room after patient spontaneously birthed female infant in one push with RN in room. On entering room, patient was found in stable condition with infant skin-to-skin.  The placenta delivered spontaneously and was noted to be intact with a 3VC. The cord was doubly clamped and cut after delivery of the placenta.  A perineal and vaginal examination was performed. Episiotomy/Lacerations: None  The patient tolerated this well. Mother and baby were left in stable condition.  Dr. Valentino Saxon was immediately available for the care of this patient.   Lindalou Hose Sybilla Malhotra, CNM 03/23/2023 5:19 AM

## 2023-03-17 ENCOUNTER — Encounter: Payer: Self-pay | Admitting: Obstetrics and Gynecology

## 2023-03-17 ENCOUNTER — Other Ambulatory Visit: Payer: Self-pay

## 2023-03-17 ENCOUNTER — Observation Stay
Admission: EM | Admit: 2023-03-17 | Discharge: 2023-03-17 | Disposition: A | Discharge status: Home / Self Care | Payer: Self-pay | Attending: Obstetrics and Gynecology | Admitting: Obstetrics and Gynecology

## 2023-03-17 DIAGNOSIS — O0933 Supervision of pregnancy with insufficient antenatal care, third trimester: Secondary | ICD-10-CM

## 2023-03-17 DIAGNOSIS — O26893 Other specified pregnancy related conditions, third trimester: Principal | ICD-10-CM | POA: Insufficient documentation

## 2023-03-17 DIAGNOSIS — R519 Headache, unspecified: Principal | ICD-10-CM | POA: Insufficient documentation

## 2023-03-17 DIAGNOSIS — Z3A38 38 weeks gestation of pregnancy: Secondary | ICD-10-CM | POA: Insufficient documentation

## 2023-03-17 DIAGNOSIS — F1490 Cocaine use, unspecified, uncomplicated: Secondary | ICD-10-CM

## 2023-03-17 DIAGNOSIS — R102 Pelvic and perineal pain: Secondary | ICD-10-CM

## 2023-03-17 DIAGNOSIS — F17210 Nicotine dependence, cigarettes, uncomplicated: Secondary | ICD-10-CM | POA: Insufficient documentation

## 2023-03-17 DIAGNOSIS — F1290 Cannabis use, unspecified, uncomplicated: Secondary | ICD-10-CM | POA: Insufficient documentation

## 2023-03-17 DIAGNOSIS — O99324 Drug use complicating childbirth: Secondary | ICD-10-CM

## 2023-03-17 DIAGNOSIS — O99323 Drug use complicating pregnancy, third trimester: Secondary | ICD-10-CM | POA: Insufficient documentation

## 2023-03-17 DIAGNOSIS — Z79899 Other long term (current) drug therapy: Secondary | ICD-10-CM | POA: Insufficient documentation

## 2023-03-17 LAB — URINALYSIS, ROUTINE W REFLEX MICROSCOPIC
Bilirubin Urine: NEGATIVE
Glucose, UA: NEGATIVE mg/dL
Hgb urine dipstick: NEGATIVE
Ketones, ur: NEGATIVE mg/dL
Leukocytes,Ua: NEGATIVE
Nitrite: NEGATIVE
Protein, ur: NEGATIVE mg/dL
Specific Gravity, Urine: 1.02 (ref 1.005–1.030)
pH: 6 (ref 5.0–8.0)

## 2023-03-17 LAB — URINE DRUG SCREEN, QUALITATIVE (ARMC ONLY)
Amphetamines, Ur Screen: NOT DETECTED
Barbiturates, Ur Screen: NOT DETECTED
Benzodiazepine, Ur Scrn: NOT DETECTED
Cannabinoid 50 Ng, Ur ~~LOC~~: POSITIVE — AB
Cocaine Metabolite,Ur ~~LOC~~: POSITIVE — AB
MDMA (Ecstasy)Ur Screen: NOT DETECTED
Methadone Scn, Ur: NOT DETECTED
Opiate, Ur Screen: NOT DETECTED
Phencyclidine (PCP) Ur S: NOT DETECTED
Tricyclic, Ur Screen: NOT DETECTED

## 2023-03-17 LAB — GROUP B STREP BY PCR: Group B strep by PCR: NEGATIVE

## 2023-03-17 MED ORDER — ACETAMINOPHEN 500 MG PO TABS
1000.0000 mg | ORAL_TABLET | Freq: Four times a day (QID) | ORAL | Status: DC | PRN
  Administered 2023-03-17: 1000 mg via ORAL
  Filled 2023-03-17: qty 2

## 2023-03-17 NOTE — OB Triage Note (Signed)
 Patient is a [id number] at unknown gestation with no prenatal care with c/o dizziness, spots in vision, and headache that started around 0800. Reports +fetal movement and occasional contractions about 30-40 min. Denies vaginal bleeding. External monitors applied and assessing. Initial fht 145. Initial BP 134/82, cycling q .

## 2023-03-17 NOTE — Final Progress Note (Signed)
 OB/Triage Note  Patient ID: Brianna Jarvis MRN: [id number] DOB/AGE: 1990-04-25 33 y.o.  Subjective  History of Present Illness: The patient is a 33 y.o. female [id number] at [redacted]w[redacted]d who presents for headache and right flank pain. Reports HA and right flank pain both starting about 8am this morning while at work at [employer] Headache pain is directly behind right eye, constant, not throbbing or pulsing. Does have photo/phonophobia. Has not taken anything for pain. Was concerned about possible preeclampsia. Denies visual changes. Pain is 8/10. Denies RUQ pain. Right flank pain started about 8am as well. Denies dysuria, hematuria, urgency, frequency, suprapubic pain or fever. Also reports infrequent contractions, roughly every 30-32min, they feel like braxton hicks contractions. Reports normal leukorrhea discharge. Denies leaking of clear fluid, blood, painful contractions. Reports good fetal movement.  Pregnancy has been complicated by no prenatal care. She was seen in OB/triage once on 12/13/22 for pink vaginal discharge but left AMA. Initial OB labs were collected at that time. She had an approximate LMP of 05/02/22 which would give an EDD of 02/06/23. An u/s was done on 12/3 which gave an EDD of 03/27/23, this is the current working EDD. This made her 25wk1d at the time of her ultrasound. Had anatomy scan with some incomplete views, baby was breech at the time, placenta posterior and not covering the cervical os. UDS on 12/3 was positive for cocaine and THC. Patient states she is not using cocaine currently. Has not received prenatal care due to her job at [employer]   .   Past Medical History:  Diagnosis Date   Heart murmur    Ovarian cyst     Past Surgical History:  Procedure Laterality Date   WISDOM TOOTH EXTRACTION     four; 2017    No current facility-administered medications on file prior to encounter.   Current Outpatient Medications on File Prior to Encounter  Medication Sig Dispense Refill    Iron-FA-B Cmp-C-Biot-Probiotic (FUSION PLUS) CAPS Take 1 tablet by mouth daily. (Patient not taking: Reported on 02/06/2022) 30 capsule 4    No Known Allergies  Social History   Socioeconomic History   Marital status: Significant Other    Spouse name: Brianna Jarvis   Number of children: 3   Years of education: 13   Highest education level: GED or equivalent  Occupational History   Occupation: Conservation officer, nature service  Tobacco Use   Smoking status: Every Day    Current packs/day: 0.50    Types: Cigarettes    Passive exposure: Current (FOB smokes)   Smokeless tobacco: Never  Vaping Use   Vaping status: Never Used  Substance and Sexual Activity   Alcohol use: Not Currently    Comment: red wine 2 weeks ago   Drug use: Not Currently    Types: Marijuana   Sexual activity: Yes    Partners: Male    Comment: undecided  Other Topics Concern   Not on file  Social History Narrative   Not on file   Social Drivers of Health   Financial Resource Strain: Low Risk  (10/12/2021)   Overall Financial Resource Strain (CARDIA)    Difficulty of Paying Living Expenses: Not very hard  Food Insecurity: Food Insecurity Present (10/12/2021)   Hunger Vital Sign    Worried About Running Out of Food in the Last Year: Sometimes true    Ran Out of Food in the Last Year: Sometimes true  Transportation Needs: No Transportation Needs (10/12/2021)   PRAPARE - Transportation  Lack of Transportation (Medical): No    Lack of Transportation (Non-Medical): No  Physical Activity: Inactive (10/12/2021)   Exercise Vital Sign    Days of Exercise per Week: 0 days    Minutes of Exercise per Session: 0 min  Stress: No Stress Concern Present (10/12/2021)   Brianna Jarvis [employer] - Occupational Stress Questionnaire    Feeling of Stress : Not at all  Social Connections: Unknown (10/12/2021)   Social Connection and Isolation Panel [NHANES]    Frequency of Communication with Friends and Family:  Not on file    Frequency of Social Gatherings with Friends and Family: Not on file    Attends Religious Services: Never    Active Member of Clubs or Organizations: No    Attends Banker Meetings: Never    Marital Status: Living with partner  Intimate Partner Violence: Not At Risk (10/12/2021)   Humiliation, Afraid, Rape, and Kick questionnaire    Fear of Current or Ex-Partner: No    Emotionally Abused: No    Physically Abused: No    Sexually Abused: No    Family History  Problem Relation Age of Onset   Diabetes Mother    Diabetes Father    Cancer Father 28       thyroid   Kidney failure Brother        as a baby   Stroke Maternal Grandmother    Diabetes Maternal Grandmother    Dementia Paternal Grandfather      ROS    Objective  Physical Exam: BP 124/73   Pulse 60   Temp 98 F (36.7 C) (Oral)   Resp 16   Ht 5\' 6"  (1.676 m)   LMP 05/02/2022 (Approximate)   BMI 35.67 kg/m   OBGyn Exam  FHT 135, mod variability, pos accels, no decels Toco infrequent contractions, not painful for [facility] [place] Course: The patient was admitted to [facility] Triage for observation. Had reactive NST. GBS, UDS and UA collected. Oral hydration encouraged and tylenol given for pain. UA and PCR GBS were both negative, GBS culture pending. UDS positive for cocaine and THC. After resting, drinking water, eating and having tylenol, her headache pain severity decreased to only 2/10 and she felt much better and comfortable doing home. Preeclampsia was ruled out with normotensive pressures. UTI ruled out. Brianna Jarvis did not feel like she was laboring, therefore SVE not performed or warranted.   Assessment: 33 y.o. female [phone] at [redacted]w[redacted]d  Headache- improved with tylenol Normotensive UDS- positive for cocaine and THC  Plan: Discharge home Follow up: make appt with AOB to establish for prenatal care Teaching: return when in labor: labor s/sx  Discharge Instructions     Discharge  activity:  No Restrictions   Complete by: As directed    Discharge diet:  No restrictions   Complete by: As directed    Discharge instructions   Complete by: As directed    Make a prenatal appointment to establish care with [facility]   LABOR:  When conractions begin, you should start to time them from the beginning of one contraction to the beginning  of the next.  When contractions are 5 - 10 minutes apart or less and have been regular for at least an hour, you should call your health care provider.   Complete by: As directed    No sexual activity restrictions   Complete by: As directed    Notify physician for bleeding from the vagina  Complete by: As directed    Notify physician for blurring of vision or spots before the eyes   Complete by: As directed    Notify physician for chills or fever   Complete by: As directed    Notify physician for fainting spells, "black outs" or loss of consciousness   Complete by: As directed    Notify physician for increase in vaginal discharge   Complete by: As directed    Notify physician for leaking of fluid   Complete by: As directed    Notify physician for pain or burning when urinating   Complete by: As directed    Notify physician for pelvic pressure (sudden increase)   Complete by: As directed    Notify physician for severe or continued nausea or vomiting   Complete by: As directed    Notify physician for sudden gushing of fluid from the vagina (with or without continued leaking)   Complete by: As directed    Notify physician for sudden, constant, or occasional abdominal pain   Complete by: As directed    Notify physician if baby moving less than usual   Complete by: As directed       Allergies as of 03/17/2023   No Known Allergies      Medication List     TAKE these medications    Fusion Plus Caps Take 1 tablet by mouth daily.         Total time spent taking care of this patient: 3 hours  Signed: Raeford Razor  CNM, FNP 03/17/2023, 2:51 PM

## 2023-03-17 NOTE — OB Triage Note (Addendum)
 Pt discharged home per order. Pt reports pain almost relieved by tylenol, PO hydration, and rest. Will call office to make appt.   Pt stable and ambulatory and an After Visit Summary was printed and given to the patient. Discharge education completed with patient/family including follow up instructions, appointments, and medication list. Pt received labor and PIH precautions. Patient able to verbalize understanding, all questions fully answered upon discharge. Patient instructed to return to ED, call 911, or call MD for any changes in condition. Pt discharged home via personal vehicle with support person.

## 2023-03-18 LAB — CULTURE, BETA STREP (GROUP B ONLY)

## 2023-03-23 ENCOUNTER — Other Ambulatory Visit: Payer: Self-pay

## 2023-03-23 ENCOUNTER — Inpatient Hospital Stay
Admission: EM | Admit: 2023-03-23 | Discharge: 2023-03-24 | DRG: 807 | Disposition: A | Discharge status: Home / Self Care | Payer: MEDICAID

## 2023-03-23 ENCOUNTER — Encounter: Payer: Self-pay | Admitting: Obstetrics and Gynecology

## 2023-03-23 DIAGNOSIS — F17210 Nicotine dependence, cigarettes, uncomplicated: Secondary | ICD-10-CM | POA: Diagnosis present

## 2023-03-23 DIAGNOSIS — Z833 Family history of diabetes mellitus: Secondary | ICD-10-CM

## 2023-03-23 DIAGNOSIS — O26893 Other specified pregnancy related conditions, third trimester: Secondary | ICD-10-CM | POA: Diagnosis present

## 2023-03-23 DIAGNOSIS — Z3A39 39 weeks gestation of pregnancy: Secondary | ICD-10-CM

## 2023-03-23 DIAGNOSIS — O99334 Smoking (tobacco) complicating childbirth: Secondary | ICD-10-CM | POA: Diagnosis present

## 2023-03-23 DIAGNOSIS — O99324 Drug use complicating childbirth: Secondary | ICD-10-CM | POA: Diagnosis present

## 2023-03-23 DIAGNOSIS — O164 Unspecified maternal hypertension, complicating childbirth: Secondary | ICD-10-CM

## 2023-03-23 DIAGNOSIS — F1290 Cannabis use, unspecified, uncomplicated: Secondary | ICD-10-CM | POA: Diagnosis present

## 2023-03-23 DIAGNOSIS — O0933 Supervision of pregnancy with insufficient antenatal care, third trimester: Secondary | ICD-10-CM

## 2023-03-23 DIAGNOSIS — R030 Elevated blood-pressure reading, without diagnosis of hypertension: Secondary | ICD-10-CM | POA: Diagnosis present

## 2023-03-23 DIAGNOSIS — F1910 Other psychoactive substance abuse, uncomplicated: Secondary | ICD-10-CM

## 2023-03-23 LAB — CBC
HCT: 30.2 % — ABNORMAL LOW (ref 36.0–46.0)
Hemoglobin: 9.5 g/dL — ABNORMAL LOW (ref 12.0–15.0)
MCH: 22 pg — ABNORMAL LOW (ref 26.0–34.0)
MCHC: 31.5 g/dL (ref 30.0–36.0)
MCV: 70.1 fL — ABNORMAL LOW (ref 80.0–100.0)
Platelets: 126 10*3/uL — ABNORMAL LOW (ref 150–400)
RBC: 4.31 MIL/uL (ref 3.87–5.11)
RDW: 14.3 % (ref 11.5–15.5)
WBC: 7.6 10*3/uL (ref 4.0–10.5)
nRBC: 0 % (ref 0.0–0.2)

## 2023-03-23 LAB — COMPREHENSIVE METABOLIC PANEL
ALT: 11 U/L (ref 0–44)
AST: 17 U/L (ref 15–41)
Albumin: 3.1 g/dL — ABNORMAL LOW (ref 3.5–5.0)
Alkaline Phosphatase: 111 U/L (ref 38–126)
Anion gap: 7 (ref 5–15)
BUN: 7 mg/dL (ref 6–20)
CO2: 19 mmol/L — ABNORMAL LOW (ref 22–32)
Calcium: 9.2 mg/dL (ref 8.9–10.3)
Chloride: 107 mmol/L (ref 98–111)
Creatinine, Ser: 0.39 mg/dL — ABNORMAL LOW (ref 0.44–1.00)
GFR, Estimated: >60 mL/min (ref >60)
Glucose, Bld: 85 mg/dL (ref 70–99)
Potassium: 4.1 mmol/L (ref 3.5–5.1)
Sodium: 133 mmol/L — ABNORMAL LOW (ref 135–145)
Total Bilirubin: 0.6 mg/dL (ref 0.0–1.2)
Total Protein: 6.8 g/dL (ref 6.5–8.1)

## 2023-03-23 LAB — URINE DRUG SCREEN, QUALITATIVE (ARMC ONLY)
Amphetamines, Ur Screen: NOT DETECTED
Barbiturates, Ur Screen: NOT DETECTED
Benzodiazepine, Ur Scrn: NOT DETECTED
Cannabinoid 50 Ng, Ur ~~LOC~~: POSITIVE — AB
Cocaine Metabolite,Ur ~~LOC~~: NOT DETECTED
MDMA (Ecstasy)Ur Screen: NOT DETECTED
Methadone Scn, Ur: NOT DETECTED
Opiate, Ur Screen: NOT DETECTED
Phencyclidine (PCP) Ur S: NOT DETECTED
Tricyclic, Ur Screen: NOT DETECTED

## 2023-03-23 LAB — PROTEIN / CREATININE RATIO, URINE
Creatinine, Urine: 46 mg/dL
Total Protein, Urine: <6 mg/dL

## 2023-03-23 LAB — RAPID HIV SCREEN (HIV 1/2 AB+AG)
HIV 1/2 Antibodies: NONREACTIVE
HIV-1 P24 Antigen - HIV24: NONREACTIVE

## 2023-03-23 LAB — RPR: RPR Ser Ql: NONREACTIVE

## 2023-03-23 LAB — TYPE AND SCREEN
ABO/RH(D): A POS
Antibody Screen: NEGATIVE

## 2023-03-23 LAB — RUPTURE OF MEMBRANE (ROM)PLUS: Rom Plus: NEGATIVE

## 2023-03-23 LAB — CHLAMYDIA/NGC RT PCR (ARMC ONLY)
Chlamydia Tr: NOT DETECTED
N gonorrhoeae: NOT DETECTED

## 2023-03-23 MED ORDER — PRENATAL MULTIVITAMIN CH
1.0000 | ORAL_TABLET | Freq: Every day | ORAL | Status: DC
  Administered 2023-03-23 – 2023-03-24 (×2): 1 via ORAL
  Filled 2023-03-23 (×3): qty 1

## 2023-03-23 MED ORDER — LACTATED RINGERS IV SOLN: INTRAVENOUS | Status: DC

## 2023-03-23 MED ORDER — SODIUM CHLORIDE 0.9 % IV SOLN: INTRAVENOUS | Status: DC | PRN

## 2023-03-23 MED ORDER — OXYTOCIN-SODIUM CHLORIDE 30-0.9 UT/500ML-% IV SOLN: 2.5000 [IU]/h | INTRAVENOUS | Status: DC | PRN

## 2023-03-23 MED ORDER — OXYTOCIN-SODIUM CHLORIDE 30-0.9 UT/500ML-% IV SOLN
1.0000 m[IU]/min | INTRAVENOUS | Status: DC
  Filled 2023-03-23: qty 500

## 2023-03-23 MED ORDER — SOD CITRATE-CITRIC ACID 500-334 MG/5ML PO SOLN: 30.0000 mL | ORAL | Status: DC | PRN

## 2023-03-23 MED ORDER — LACTATED RINGERS IV SOLN: 500.0000 mL | INTRAVENOUS | Status: DC | PRN

## 2023-03-23 MED ORDER — OXYCODONE-ACETAMINOPHEN 5-325 MG PO TABS: 2.0000 | ORAL_TABLET | ORAL | Status: DC | PRN

## 2023-03-23 MED ORDER — LIDOCAINE HCL (PF) 1 % IJ SOLN: 30.0000 mL | INTRAMUSCULAR | Status: DC | PRN

## 2023-03-23 MED ORDER — OXYTOCIN 10 UNIT/ML IJ SOLN: 10.0000 [IU] | Freq: Once | INTRAMUSCULAR | Status: DC

## 2023-03-23 MED ORDER — SIMETHICONE 80 MG PO CHEW: 80.0000 mg | CHEWABLE_TABLET | ORAL | Status: DC | PRN

## 2023-03-23 MED ORDER — LIDOCAINE HCL (PF) 1 % IJ SOLN
INTRAMUSCULAR | Status: AC
  Filled 2023-03-23: qty 30

## 2023-03-23 MED ORDER — TETANUS-DIPHTH-ACELL PERTUSSIS 5-2.5-18.5 LF-MCG/0.5 IM SUSY
0.5000 mL | PREFILLED_SYRINGE | Freq: Once | INTRAMUSCULAR | Status: DC
  Filled 2023-03-23: qty 0.5

## 2023-03-23 MED ORDER — COCONUT OIL OIL
1.0000 | TOPICAL_OIL | Status: DC | PRN
  Administered 2023-03-24: 1 via TOPICAL
  Filled 2023-03-23: qty 15

## 2023-03-23 MED ORDER — WITCH HAZEL-GLYCERIN EX PADS
MEDICATED_PAD | CUTANEOUS | Status: DC | PRN
  Filled 2023-03-23: qty 100

## 2023-03-23 MED ORDER — LABETALOL HCL 5 MG/ML IV SOLN
20.0000 mg | INTRAVENOUS | Status: DC | PRN
  Administered 2023-03-23: 20 mg via INTRAVENOUS

## 2023-03-23 MED ORDER — AMMONIA AROMATIC IN INHA
RESPIRATORY_TRACT | Status: AC
  Filled 2023-03-23: qty 10

## 2023-03-23 MED ORDER — BENZOCAINE-MENTHOL 20-0.5 % EX AERO
1.0000 | INHALATION_SPRAY | CUTANEOUS | Status: DC | PRN
  Filled 2023-03-23: qty 56

## 2023-03-23 MED ORDER — ACETAMINOPHEN 325 MG PO TABS
650.0000 mg | ORAL_TABLET | ORAL | Status: DC | PRN
  Administered 2023-03-23 – 2023-03-24 (×3): 650 mg via ORAL
  Filled 2023-03-23 (×3): qty 2

## 2023-03-23 MED ORDER — LIDOCAINE HCL (PF) 1 % IJ SOLN: INTRAMUSCULAR | Status: DC | PRN

## 2023-03-23 MED ORDER — ACETAMINOPHEN 325 MG PO TABS
650.0000 mg | ORAL_TABLET | ORAL | Status: DC | PRN
  Administered 2023-03-23: 650 mg via ORAL
  Filled 2023-03-23: qty 2

## 2023-03-23 MED ORDER — LABETALOL HCL 5 MG/ML IV SOLN
INTRAVENOUS | Status: AC
  Filled 2023-03-23: qty 4

## 2023-03-23 MED ORDER — FENTANYL-BUPIVACAINE-NACL 0.5-0.125-0.9 MG/250ML-% EP SOLN: EPIDURAL | Status: DC | PRN

## 2023-03-23 MED ORDER — LABETALOL HCL 5 MG/ML IV SOLN: 80.0000 mg | INTRAVENOUS | Status: DC | PRN

## 2023-03-23 MED ORDER — OXYTOCIN BOLUS FROM INFUSION
333.0000 mL | Freq: Once | INTRAVENOUS | Status: AC
  Administered 2023-03-23: 333 mL via INTRAVENOUS

## 2023-03-23 MED ORDER — HYDRALAZINE HCL 20 MG/ML IJ SOLN: 10.0000 mg | INTRAMUSCULAR | Status: DC | PRN

## 2023-03-23 MED ORDER — DOCUSATE SODIUM 100 MG PO CAPS
100.0000 mg | ORAL_CAPSULE | Freq: Two times a day (BID) | ORAL | Status: DC
  Administered 2023-03-23 – 2023-03-24 (×2): 100 mg via ORAL
  Filled 2023-03-23 (×2): qty 1

## 2023-03-23 MED ORDER — OXYCODONE-ACETAMINOPHEN 5-325 MG PO TABS
1.0000 | ORAL_TABLET | ORAL | Status: DC | PRN
  Administered 2023-03-23: 1 via ORAL
  Filled 2023-03-23: qty 1

## 2023-03-23 MED ORDER — IBUPROFEN 600 MG PO TABS
600.0000 mg | ORAL_TABLET | Freq: Four times a day (QID) | ORAL | Status: DC
  Administered 2023-03-23 – 2023-03-24 (×5): 600 mg via ORAL
  Filled 2023-03-23 (×5): qty 1

## 2023-03-23 MED ORDER — LABETALOL HCL 5 MG/ML IV SOLN: 40.0000 mg | INTRAVENOUS | Status: DC | PRN

## 2023-03-23 MED ORDER — MISOPROSTOL 200 MCG PO TABS
ORAL_TABLET | ORAL | Status: AC
  Filled 2023-03-23: qty 4

## 2023-03-23 MED ORDER — ONDANSETRON HCL 4 MG/2ML IJ SOLN: 4.0000 mg | Freq: Four times a day (QID) | INTRAMUSCULAR | Status: DC | PRN

## 2023-03-23 MED ORDER — OXYTOCIN-SODIUM CHLORIDE 30-0.9 UT/500ML-% IV SOLN
2.5000 [IU]/h | INTRAVENOUS | Status: DC
  Administered 2023-03-23: 2.5 [IU]/h via INTRAVENOUS

## 2023-03-23 MED ORDER — LIDOCAINE-EPINEPHRINE (PF) 1.5 %-1:200000 IJ SOLN: INTRAMUSCULAR | Status: DC | PRN

## 2023-03-23 MED ORDER — DIPHENHYDRAMINE HCL 25 MG PO CAPS: 25.0000 mg | ORAL_CAPSULE | Freq: Four times a day (QID) | ORAL | Status: DC | PRN

## 2023-03-23 NOTE — Lactation Note (Signed)
 This note was copied from a baby's chart. Lactation Consultation Note  Patient Name: Brianna Jarvis Today's Date: 03/23/2023 Age:35 hours     Maternal Data     Maternal pt, support person, and infant all sleeping separately in dark room upon LC entry. Provided phone number to contact in case any issues arise or feeding observation requested. Follow up prior to EOB for initial education and prior to discharge for infant and breast care and outpatient information.                       Consult Status      Lennie Hummer 03/23/2023, 12:54 PM

## 2023-03-23 NOTE — Progress Notes (Signed)
 Labor Progress Note   ASSESSMENT/PLAN   Daizee Firmin 33 y.o.   [id number]  at [redacted]w[redacted]d here with spontaneous labor.  FWB:  - Fetal well being assessed: reassuring on intermittent monitoring, patient moving in bed frequently making continuous monitoring difficult      GBS: - GBS PRESUMPTIVE NEGATIVE/-- (03/07 1142)    LABOR: - Now in active labor, coping  well. - Pain Management: position changes and breathing through contractions, requesting nitrous oxide - Discussed options with patient and will continue with expectant management. - Anticipate SVD   Labor Progress: 0254  5/100/-1 0400  7/100 0445  8/100/0  SUBJECTIVE/OBJECTIVE   SUBJECTIVE:  Patient feeling more pressure. States that she is "tired". Breathing well through contractions, moving from hands and knees to side and back. Partner at bedside.   OBJECTIVE: Vital Signs: Patient Vitals for the past 12 hrs:  BP Temp Temp src Pulse Resp Height Weight  03/23/23 0136 (!) 148/90 98.3 F (36.8 C) Oral 78 16 5\' 6"  (1.676 m) 98.4 kg    Last SVE:  Dilation: 7 Effacement (%): 100 Station: -1 Presentation: Vertex Exam by:: S Vannie Hochstetler CNM -  , Rupture Date: 03/23/23, Rupture Time: 0254,    FHR:   - Mode: External  - Baseline Rate (A): 135 bpm  -    - Characteristics (ie - accels, decels): Accelerations: 15 x 15  -    UTERINE ACTIVITY:   - Mode: Toco  - Contraction Frequency (min): 2.5-5 minutes

## 2023-03-23 NOTE — H&P (Signed)
 [facility] Labor & Delivery  History and Physical  ASSESSMENT AND PLAN   Brianna Jarvis is a 33 y.o. [id number] at [redacted]w[redacted]d with EDD: 03/27/2023, by 25 week Ultrasound admitted for management of labor. Pregnancy complicated by scant prenatal care with only two triage visits and no clinic visits during this pregnancy.   Labor Management - ROM+ negative. Given elevated blood pressures at term and advanced cervical dilation in a grand multip, offered augmentation of labor with AROM which patient accepted. AROM to clear fluid. May consider pitocin if labor does not progress spontaneously. - Patient desires unmedicated birth. Aware of pain management options.  - Anticipate SVD.  Other Active Problems:  Scant prenatal care - Was only seen in OB triage twice during this pregnancy, no other prenatal care.   Polysubstance Use in Pregnancy - Positive for THC and cocaine at two previous triage visits.  - Neg for cocaine on today's admission, positive for THC.  Elevated blood pressure - Blood pressure mild range on admission. - PIH labs WNL except for platelets of 126. Platelets 147 at last triage visit on 3/7.    Fetal Status: - cephalic presentation by BSUS - EFW: 7.5 lbs by Leopold's - Continuous fetal heart rate monitoring - FHT currently cat I   Labs/Immunizations: Prenatal labs and studies: ABO, Rh: --/--/PENDING (03/13 0125) Antibody: PENDING (03/13 0125) Rubella: 2.94 (12/03 1520) Varicella:    RPR: NON REACTIVE (12/03 1520)  HBsAg: NON REACTIVE (12/03 1520)  HepC:   HIV: NON REACTIVE (03/13 0125)  GBS: PRESUMPTIVE NEGATIVE/-- (03/07 1142)   TDAP: not given Flu: not given RSV: no  Postpartum Plan: - Feeding: Breast Milk and Formula - Contraception: plans undecided - Prenatal Care Provider: none, desires AOB     HPI   Chief Complaint: Contractions  Brianna Jarvis is a 33 y.o. [id number] at [redacted]w[redacted]d who presents for evaluation of labor. She started  contracting approximately 24 hours ago and feels like she may have been leaking fluid since that time. She denies vaginal bleeding. Endorses normal fetal movement.     Pregnancy Complications Patient Active Problem List   Diagnosis Date Noted   Labor and delivery indication for care or intervention 03/23/2023   Elevated blood pressure reading 03/23/2023   Headache in pregnancy, antepartum, third trimester 03/17/2023   Abdominal cramping affecting pregnancy 12/13/2022   Uterine contractions 02/06/2022   Impaired glucose tolerance during pregnancy, antepartum 12/10/2021   Late prenatal care 11/19/2021   Substance use disorder 11/19/2021   Trichomoniasis 11/19/2021   Anemia of pregnancy in second trimester 11/19/2021   Pregnancy, supervision, high-risk 10/12/2021    Review of Systems A twelve point review of systems was negative except as stated in HPI.   HISTORY   Medications Medications Prior to Admission  Medication Sig Dispense Refill Last Dose/Taking   Iron-FA-B Cmp-C-Biot-Probiotic (FUSION PLUS) CAPS Take 1 tablet by mouth daily. (Patient not taking: Reported on 02/06/2022) 30 capsule 4     Allergies has no known allergies.   OB History OB History  Gravida Para Term Preterm AB Living  7 4 4  0 2 4  SAB IAB Ectopic Multiple Live Births  2 0 0 0 4    # Outcome Date GA Lbr Len/2nd Weight Sex Type Anes PTL Lv  7 Current           6 Term 02/07/22 [redacted]w[redacted]d  3130 g F Vag-Spont None  LIV     Name: Brianna, Jarvis     Apgar1: 7  Apgar5: 8  5 SAB 2022          4 SAB 2021          3 Term 03/02/16 [redacted]w[redacted]d  3005 g M   N LIV  2 Term 12/08/11 [redacted]w[redacted]d  3005 g F Vag-Spont  N LIV  1 Term 10/05/05 [redacted]w[redacted]d  3005 g Lanier Clam LIV    Past Medical History Past Medical History:  Diagnosis Date   Heart murmur    Ovarian cyst     Past Surgical History Past Surgical History:  Procedure Laterality Date   WISDOM TOOTH EXTRACTION     four; 2017    Social History  reports that she has been  smoking cigarettes. She has been exposed to tobacco smoke. She has never used smokeless tobacco. She reports that she does not currently use alcohol. She reports that she does not currently use drugs after having used the following drugs: Marijuana.   Family History family history includes Cancer (age of onset: 19) in her father; Dementia in her paternal grandfather; Diabetes in her father, maternal grandmother, and mother; Kidney failure in her brother; Stroke in her maternal grandmother.   PHYSICAL EXAM   Vitals:   03/23/23 0136  BP: (!) 148/90  Pulse: 78  Resp: 16  Temp: 98.3 F (36.8 C)  TempSrc: Oral  Weight: 98.4 kg  Height: 5\' 6"  (1.676 m)    Constitutional: No acute distress, well appearing, and well nourished. Neurologic: She is alert and conversational.  Psychiatric: She has a normal mood and affect.  Musculoskeletal: Normal gait, grossly normal range of motion Cardiovascular: Normal rate.   Pulmonary/Chest: Normal work of breathing.  Gastrointestinal/Abdominal: Soft. Gravid. There is no tenderness.  Skin: Skin is warm and dry. No rash noted.  Genitourinary: Normal external female genitalia.  SVE:   Dilation: 5 Effacement (%): 100 Station: -1 Presentation: Vertex Exam by:: S Jozlyn Schatz CNM SSE: deferred  NST Interpretation Indication: Contractions Baseline: 140 bpm Variability: moderate Accelerations: present Decelerations: none noted, broken tracing Contractions: regular, every 5-7 minutes Time noted:  See OBIX Impression: reactive Authenticated by: Lindalou Hose Franco Duley   Attending Dr. Valentino Saxon was immediately available for the care of the patient.   Lindalou Hose Kaliann Coryell, CNM

## 2023-03-23 NOTE — Discharge Summary (Signed)
 Postpartum Discharge Summary  Date of Service updated: 03/24/2023     Patient Name: Brianna Jarvis DOB: 03/11/1990 MRN: [id number]  Date of admission: 03/23/2023 Delivery date:03/23/2023 Delivering provider: FREE, Lindalou Hose Date of discharge: 03/24/2023  Admitting diagnosis: Labor and delivery indication for care or intervention [O75.9] Intrauterine pregnancy: [redacted]w[redacted]d     Secondary diagnosis:  Principal Problem:   Labor and delivery indication for care or intervention Active Problems:   Elevated blood pressure reading   Postpartum care following vaginal delivery   Encounter for care or examination of lactating mother Polysubstance use in pregnancy    Discharge diagnosis: Term Pregnancy Delivered                                              Post partum procedures: Rh positive Augmentation: AROM Complications: None  Hospital course: Onset of Labor With Vaginal Delivery      33 y.o. yo [id number] at [redacted]w[redacted]d was admitted in Active Labor on 03/23/2023. Labor course was uncomplicated. Membrane Rupture Time/Date: 2:54 AM,03/23/2023  Delivery Method:Vaginal, Spontaneous Operative Delivery:N/A Episiotomy: None Lacerations:  None See delivery note for details  Patient had a postpartum course complicated by elevated blood pressure. IV Labetalol administered. Normal BP currently.  She is tolerating regular diet, pain is controlled with PO medications, ambulating and voiding without difficulty. She reports currently breast and formula feeding.   Patient is discharged home in stable condition on 03/24/23.  Newborn Data: Birth date:03/23/2023 Birth time:5:06 AM Gender:Female     [place] Living status:Living Apgars:8 ,9  Weight:2930 g   Magnesium Sulfate received: No BMZ received: No Rhophylac:N/A MMR:N/A T-DaP: given 2023 Flu: No RSV Vaccine received: No Transfusion:No  Immunizations administered: Immunization History  Administered Date(s) Administered   Hepatitis B 06/24/1996,  07/24/1996   Influenza, Seasonal, Injecte, Preservative Fre 11/19/2021   MMR 03/20/1992   Tdap 10/05/2011, 11/19/2021    Physical exam  Vitals:   03/23/23 1741 03/23/23 1959 03/23/23 2345 03/24/23 0759  BP: 129/83 135/85 (!) 130/91 129/80  Pulse: 64 63 69 68  Resp:  20 20 19   Temp:  98.4 F (36.9 C) 97.8 F (36.6 C) 97.9 F (36.6 C)  TempSrc:  Oral Oral Oral  SpO2: 100% 100% 99% 100%  Weight:      Height:       General: alert, cooperative, and no distress Lochia: appropriate Uterine Fundus: firm Incision: N/A DVT Evaluation: No evidence of DVT seen on physical exam. Labs: Lab Results  Component Value Date   WBC 9.5 03/24/2023   HGB 8.5 (L) 03/24/2023   HCT 27.1 (L) 03/24/2023   MCV 70.0 (L) 03/24/2023   PLT 129 (L) 03/24/2023      Latest Ref Rng & Units 03/23/2023    1:25 AM  CMP  Glucose 70 - 99 mg/dL 85   BUN 6 - 20 mg/dL 7   Creatinine 1.02 - 7.25 mg/dL 3.66   Sodium 440 - 347 mmol/L 133   Potassium 3.5 - 5.1 mmol/L 4.1   Chloride 98 - 111 mmol/L 107   CO2 22 - 32 mmol/L 19   Calcium 8.9 - 10.3 mg/dL 9.2   Total Protein 6.5 - 8.1 g/dL 6.8   Total Bilirubin 0.0 - 1.2 mg/dL 0.6   Alkaline Phos 38 - 126 U/L 111   AST 15 - 41 U/L 17   ALT 0 -  44 U/L 11    Edinburgh Score:    03/24/2023    8:00 AM  Edinburgh Postnatal Depression Scale Screening Tool  I have been able to laugh and see the funny side of things. 0  I have looked forward with enjoyment to things. 0  I have blamed myself unnecessarily when things went wrong. 2  I have been anxious or worried for no good reason. 0  I have felt scared or panicky for no good reason. 0  Things have been getting on top of me. 0  I have been so unhappy that I have had difficulty sleeping. 0  I have felt sad or miserable. 0  I have been so unhappy that I have been crying. 0  The thought of harming myself has occurred to me. 0  Edinburgh Postnatal Depression Scale Total 2      After visit meds:  Allergies as  of 03/24/2023   No Known Allergies      Medication List     TAKE these medications    Fusion Plus Caps Take 1 tablet by mouth daily.         Discharge home in stable condition Infant Feeding:  breast and formula Infant Disposition:home with mother Discharge instruction: per After Visit Summary and Postpartum booklet. Activity: Advance as tolerated. Pelvic rest for 6 weeks.  Diet: routine diet Anticipated Birth Control:  progesterone only (tobacco user) Postpartum Appointment:6 weeks Additional Postpartum F/U: Postpartum Depression checkup Future Appointments:No future appointments  Follow up Visit:  Follow-up Information     Free, Lindalou Hose, CNM. Schedule an appointment as soon as possible for a visit.   Specialty: Obstetrics and Gynecology Why: In 2 weeks for blood pressure/postpartum visit and in 4-6 weeks for in-person postpartum visit/ birth control Contact information: [address] [place] [address] [phone]                  03/24/2023 Tresea Mall, CNM

## 2023-03-23 NOTE — Significant Event (Signed)
 Notified by RN at approximately [phone] of patients ongoing severe range blood pressures. PIH labs drawn at admission WNL except for platelets of 126 but platelets had previously been low in this pregnancy. Patient also reports a slight headache and some spots in her vision. Labetalol pathway ordered and patient reviewed with Dr. Valentino Saxon. If blood pressures respond to labetalol and remain mild range, will not start magnesium at this time. If blood pressures continue to be severe range, consider magnesium for seizure prophylaxis.

## 2023-03-23 NOTE — Anesthesia Procedure Notes (Deleted)
 Epidural Patient location during procedure: OB End time: 03/23/2023 4:35 AM  Staffing Performed: anesthesiologist   Preanesthetic Checklist Completed: patient identified, IV checked, site marked, risks and benefits discussed, surgical consent, monitors and equipment checked, pre-op evaluation and timeout performed  Epidural Patient position: sitting Prep: Betadine Patient monitoring: heart rate, continuous pulse ox and blood pressure Approach: midline Location: L4-L5 Injection technique: LOR saline  Needle:  Needle type: Tuohy  Needle gauge: 17 G Needle length: 9 cm and 9 Needle insertion depth: 6 cm Catheter type: closed end flexible Catheter size: 19 Gauge Catheter at skin depth: 12 cm Test dose: negative and 1.5% lidocaine with Epi 1:200 K  Assessment Events: blood not aspirated, no cerebrospinal fluid, injection not painful, no injection resistance, no paresthesia and negative IV test  Additional Notes   Patient tolerated the insertion well without complications.Reason for block:procedure for pain

## 2023-03-24 LAB — CBC
HCT: 27.1 % — ABNORMAL LOW (ref 36.0–46.0)
Hemoglobin: 8.5 g/dL — ABNORMAL LOW (ref 12.0–15.0)
MCH: 22 pg — ABNORMAL LOW (ref 26.0–34.0)
MCHC: 31.4 g/dL (ref 30.0–36.0)
MCV: 70 fL — ABNORMAL LOW (ref 80.0–100.0)
Platelets: 129 10*3/uL — ABNORMAL LOW (ref 150–400)
RBC: 3.87 MIL/uL (ref 3.87–5.11)
RDW: 14.3 % (ref 11.5–15.5)
WBC: 9.5 10*3/uL (ref 4.0–10.5)
nRBC: 0 % (ref 0.0–0.2)

## 2023-03-24 NOTE — Progress Notes (Signed)
 Patient discharged. Discharge instructions given. Patient verbalizes understanding. Transported by axillary.

## 2023-03-24 NOTE — TOC Progression Note (Signed)
 Transition of Care [facility]) - Progression Note    Patient Details  Name: Brianna Jarvis MRN: [id number] Date of Birth: 08/28/1990  Transition of Care [facility]) CM/SW Contact  Garret Reddish, RN Phone Number: 03/24/2023, 3:35 PM  Clinical Narrative:    Spoke with staff nurse and she informs me that CPS worker  came on-site to see mother and baby.  CPS worker cleared baby to discharge home with mother.  CPS will follow up with mother and baby at home.          Expected Discharge Plan and Services         Expected Discharge Date: 03/24/23                                     Social Determinants of Health (SDOH) Interventions SDOH Screenings   Food Insecurity: No Food Insecurity (03/23/2023)  Housing: Low Risk  (03/23/2023)  Transportation Needs: No Transportation Needs (03/23/2023)  Utilities: Not At Risk (03/23/2023)  Depression (PHQ2-9): Low Risk  (08/02/2021)  Financial Resource Strain: Low Risk  (10/12/2021)  Physical Activity: Inactive (10/12/2021)  Social Connections: Unknown (10/12/2021)  Stress: No Stress Concern Present (10/12/2021)  Tobacco Use: High Risk (03/23/2023)    Readmission Risk Interventions     No data to display

## 2023-03-24 NOTE — Clinical Social Work Maternal (Signed)
 CLINICAL SOCIAL WORK MATERNAL/CHILD NOTE  Patient Details  Name: Brianna Jarvis MRN: [id number] Date of Birth: 1990/04/14  Date:  03/24/2023  Clinical Social Worker Initiating Note:  Brianna Speak, RN, BSN, CM Date/Time: Initiated:  03/24/23/0915     Child's Name:  Brianna Jarvis( Girl)   Biological Parents:  Mother   Need for Interpreter:  None   Reason for Referral:  Current Substance Use/Substance Use During Pregnancy     Address:  [address] [place] [address]    Phone number:  [phone] (home)     Additional phone number: [phone]  Household Members/Support Persons (HM/SP):   Household Member/Support Person 1   HM/SP Name Relationship DOB or Age  HM/SP -1 Brianna Jarvis ( FOB)  FOB   HM/SP -2  Brianna Jarvis  Daughter  Age 30  HM/SP -3        HM/SP -4        HM/SP -5        HM/SP -6        HM/SP -7        HM/SP -8          Natural Supports (not living in the home):  Spouse/significant other (FOB)   Professional Supports:     Employment: Environmental education officer   Type of Work: Designer, jewellery   Education:    Some Corporate treasurer arranged:    Architect:   (Medicaid pending)   Other Resources:  Sales executive  , WIC   Cultural/Religious Considerations Which May Impact Care:  None  Strengths:  Home prepared for child  , Pediatrician chosen   Psychotropic Medications:     None    Pediatrician:    Merchant navy officer List:   [facility] [place]    [place]  (Reports [facility])  [facility]      Pediatrician Fax Number:    Risk Factors/Current Problems:  Substance Use  , Mental Health Concerns   (Has hx of Manic Depression, Anxiety, and Bipolar.  Currently not taking any medications.  Has received couseling in the past.  Mental health and Substance abuse resources provided.)   Cognitive State:  Alert     Mood/Affect:  Calm     CSW  Assessment:  Chart reviewed.  Mother and baby present in the room on assessment.  No visitors present in the room.  I have meet with Brianna Jarvis at bedside today.  I have informed her of my role as Case Manager and the reason for my visit.  I have informed Brianna Jarvis that I received consult for Substance Abuse.  I have informed Brianna Jarvis that her urine drug screen was positive for Cannabinoid on admission.  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  I have made Brianna Jarvis aware that the baby's urine drug screen was positive for Cannabinoid on admission.  I have explained that Brianna Jarvis referral would have to be made for the above reasons.  Brianna Jarvis understanding.    Mrs.  Jarvis reports that she is feeling well after having the baby.  She informs me that her address is [address] [place], [place] [address].  She reports that her contact number is [phone].  She informs me that that she and her family where living in  motel and she was able to move into an apartment.  She reports that Brianna Jarvis is the FOB and he uses her contact number as his contact number.  She informs me that she does have some college education.  She is currently employed as a Designer, jewellery.  Brianna Jarvis states that FOB- Brianna Jarvis and her one year old daughter Brianna Jarvis lives in the home.  She reports that Brianna Jarvis will assist her with providing support with the baby.    She reports that she receives [facility] and [facility].  She plans on breast feeding and bottle feeding.  She does report that she has a breast pump.    Brianna Jarvis reports that she has a Mental Health History.  She reports that she has Manic depression, Anxiety, and Bipolar disorder. She reports that she is currently not on any medications and she has had therapy sessions in the past.  She has asked for Substance Abuse and Mental Health  Resources.  I have informed her that I have provided the Substance abuse and Mental Health Resources on the patient discharge instructions.  I have encouraged her to utilize the resources if she feels like she needs assistance after discharge.    Brianna Jarvis denies and Domestic Violence, Suicide/Homicidal thoughts.  I have discussed Postpartum Depression, Sudden Infant Death syndrome and handout sheets on the topics.  I have also given patient information sheet on car seat safety.    Brianna Jarvis informs me that she will be using [facility] for the baby's Pediatrician.  Brianna Jarvis reports that she will buy a crib for the baby after she is discharged from the hospital.  She reports that she has car seat, diapers, and clothes for the baby.  She has transportation for doctor appointments for herself and the baby.   I have spoken to Brianna Jarvis about substance abuse use in pregnancy.  She informs me that she used the Cannabinoid to assist with her appetite and the nausea in her pregnancy.  She reports that it was hard to eat with the extreme nausea that she had.  She informed me that she worked long hours daily and she used Cocaine to give her a boast.  She reports she only used Cocaine occasionally.  Noted Urine drug screen has been positive for Cocaine and Cannabinoid since December 2024.  I did inform Brianna Jarvis that baby's drug screen was positive for Cannabinoid.  Brianna Jarvis informs me that she was working long hours to provide housing for she and her daughter.  She reports that they where living in a motel.  She now has an apartment for her family to live in.  I encouraged Brianna Jarvis to utilize substance abuse resources.    I have made Brianna Jarvis report to [facility] with [facility] Brianna Jarvis.  I have informed staff nurse not to discharge baby until I have received clearance from Brianna Jarvis.    TOC will to follow for discharge planning.     CSW Plan/Description:  Child Therapist, nutritional  Report  , CSW Will Continue to Monitor Umbilical Cord Tissue Drug Screen Results and Make Report if Warranted, Other Information/Referral to [facility], Other Patient/Family Education, Sudden Infant Death Syndrome (SIDS) Education, Psychosocial Support and Ongoing Assessment of Needs    Garret Reddish, RN 03/24/2023, 9:44 AM

## 2023-03-24 NOTE — Discharge Instructions (Addendum)
 Discharge instructions Bleeding: Your bleeding could continue up to 6 weeks, the flow should gradually decrease and the color should become dark then lightened over the next couple of weeks. If you notice you are bleeding heavily or passing clots larger than the size of your fist, PLEASE call your physician. No TAMPONS, DOUCHING, ENEMAS OR SEXUAL INTERCOURSE for 6 weeks. Stitches: Shower daily with mild soap and water. Stitches will dissolve over the next couple of weeks, if you experience any discomfort in the vaginal area you may sit in warm water 15-20 minutes, 3-4 times per day. Just enough water to cover vaginal area. AfterPains: This is the uterus contracting back to its normal position and size. Use medications prescribed or recommended by your physician to help relieve this discomfort. Bowels/Hemorrhoids: Drink plenty of water and stay active. Increase fiber, fresh fruits and vegetables in your diet. Rest/Activity: Rest when the baby is resting;  Do not lift > 10 lbs for 6 weeks. No driving for 1-2 weeks. Bathing: Shower daily! Diet: Continue daily prenatal vitamin and iron until your follow up visit to help replenish nutrients and vitamins. If breastfeeding eat extra calories and increase your fluid intake to 12 glasses a day. Contraception: Consult with your provider on what method of birth control you would like to use. Breastfeeding: You may have a slight fever when your milk comes in, but it should go away on its own. If it does not, and rises above 101.0 please call the doctor. Bottlefeeding: wear a snug fitting bra without underwires continuously for 3-5days, avoid any nipple/breast stimulation. If engorgement occurs, take ibuprofen as prescribed and apply fresh green cabbage leaves directly to your breasts inside the bra cups. Postpartum "BLUES": It is common to emotional days after delivery, however if it persist for greater than 2 weeks or if you feel concerned please let your  physician know immediately. This is hormone driven and nothing you can control so please let someone know how you feel. Follow Up Visit: Please schedule a follow up visit with your delivering provider  Call office if you have any of the following: headache, visual changes, fever >101.0 F, chills, breast concerns, excessive vaginal bleeding, incision drainage or problems, leg pain or redness, depression or any other concerns.  For concerns about your baby, please call your pediatrician For breastfeeding concerns, the lactation consultant can be reached at [phone]  Intensive Outpatient Programs   [facility] [address] [place],  [place], [place] [phone]  [facility] (Inpatient and [phone] (Suboxone and Methadone) [address] [phone]  [facility]: Alcohol & Drug [facility] Programs - Intensive Outpatient [address] [place], [place] [address]  [phone]  [facility] (Outpatient, Inpatient, [facility] (Groups and Residental) (insurance only) [phone] [place], [place] [phone]   [facility] (for caregivers and family) [address] Laurell Josephs [address] [place], [place] [phone]  Residential Treatment Programs  [facility](2 years) Residential: 54 days)[facility] [address] [place], [place], [place] [phone] or [phone]  [facility] [address] [place], [place], [place] [phone]  [facility] ([facility] [address] [place], [place] [address], [place] [phone] Admissions: 8am-3pm M-F  [facility] Program: Residential Program (90 Days)  [facility]: [facility], [place], [place]  [phone] or [phone] in Hours over the weekend or by referral)  [facility] [address] [place], [place] [address] [phone] (Do virtual or phone assessment, offer transportation within 25 miles, have in patient and Outpatient options)   M[facility]il Crisis: [facility]:[phone] (for crisis response 24 hours a day)

## 2023-03-24 NOTE — Lactation Note (Signed)
 This note was copied from a baby's chart. Lactation Consultation Note  Patient Name: Brianna Jarvis Today's Date: 03/24/2023 Age:35 hours     Maternal Data   Maternal pt alert and eating lunch with infant swaddled and sleeping with support person upon [facility] room entry. MOB advised LC she was experienced at breastfeeding and had EBF her previous children, with plans to breastfeed and supplement with infant formula PRN upon discharge home. MOB stated that infant latch appears to be shallow and painful upon initiation, however manipulation of infant lip was relatively easy to perform and aided in facilitating a deeper latch with widened gape. Infant had consumed 20 mL prepared formula prior to [facility] visit and therefore denied attempt at feeding for observation and assessment; however, outpatient appointment encouraged so that any subsequent issues with latch, supply, or suppression if desired could be addressed with on-site clinician.   Feeding  No infant feed observed however reported nipple pain was addressed with signs of effective latch and milk transfer education provided.    Interventions  Refer to outpatient clinic with any questions or concerns. Updated maternal pt on mastitis protocol and benefits of ice, elevating breasts to reduce swelling, and gentle compression during feeds as opposed to heat and vigorous massage.   Discharge  Education provided regarding signs of engorgement and effective management of milk supply as well as progression of colostrum to mature milk and intake from breast reflected in diaper output. Signs of infant hunger cues and warning signs of dehydration/when to call pediatrician reviewed. Pt verbalized understanding and will reach out to appropriate care provider if any complications arise.   Consult Status   Complete     Lennie Hummer 03/24/2023, 2:54 PM

## 2023-03-27 ENCOUNTER — Inpatient Hospital Stay: Payer: MEDICAID | Admitting: Anesthesiology

## 2023-04-03 NOTE — TOC Progression Note (Signed)
 Transition of Care [facility]) - Progression Note    Patient Details  Name: Brianna Jarvis MRN: [id number] Date of Birth: Jan 26, 1990  [facility]) CM/SW Contact  Garret Reddish, RN Phone Number: 04/03/2023, 10:46 AM  Clinical Narrative:    Baby's cord blood tested positive for THC.  I have called assigned CPS worker Alveta Heimlich and made her aware of positive cord blood for baby.         Expected Discharge Plan and Services         Expected Discharge Date: 03/24/23                                     Social Determinants of Health (SDOH) Interventions SDOH Screenings   Food Insecurity: No Food Insecurity (03/23/2023)  Housing: Low Risk  (03/23/2023)  Transportation Needs: No Transportation Needs (03/23/2023)  Utilities: Not At Risk (03/23/2023)  Depression (PHQ2-9): Low Risk  (08/02/2021)  Financial Resource Strain: Low Risk  (10/12/2021)  Physical Activity: Inactive (10/12/2021)  Social Connections: Unknown (10/12/2021)  Stress: No Stress Concern Present (10/12/2021)  Tobacco Use: High Risk (03/23/2023)    Readmission Risk Interventions     No data to display
